# Patient Record
Sex: Female | Born: 1950 | Race: White | Hispanic: No | Marital: Married | Smoking: Current every day smoker
Health system: Southern US, Community
[De-identification: ages and names within clinical notes are randomized; demographics above are authoritative.]

## PROBLEM LIST (undated history)

## (undated) DIAGNOSIS — I10 Essential (primary) hypertension: Secondary | ICD-10-CM

## (undated) DIAGNOSIS — T7840XA Allergy, unspecified, initial encounter: Secondary | ICD-10-CM

## (undated) DIAGNOSIS — F329 Major depressive disorder, single episode, unspecified: Secondary | ICD-10-CM

## (undated) DIAGNOSIS — F32A Depression, unspecified: Secondary | ICD-10-CM

## (undated) DIAGNOSIS — J9383 Other pneumothorax: Secondary | ICD-10-CM

## (undated) HISTORY — DX: Other pneumothorax: J93.83

## (undated) HISTORY — DX: Depression, unspecified: F32.A

## (undated) HISTORY — DX: Major depressive disorder, single episode, unspecified: F32.9

## (undated) HISTORY — DX: Allergy, unspecified, initial encounter: T78.40XA

---

## 1976-03-19 HISTORY — PX: ABDOMINAL HYSTERECTOMY: SHX81

## 2011-07-26 ENCOUNTER — Ambulatory Visit: Payer: Self-pay | Admitting: Internal Medicine

## 2011-07-27 ENCOUNTER — Encounter: Payer: Self-pay | Admitting: Physician Assistant

## 2011-07-27 ENCOUNTER — Ambulatory Visit (INDEPENDENT_AMBULATORY_CARE_PROVIDER_SITE_OTHER): Payer: BC Managed Care – PPO | Admitting: Physician Assistant

## 2011-07-27 VITALS — BP 125/77 | HR 71 | Ht 65.0 in | Wt 144.0 lb

## 2011-07-27 DIAGNOSIS — E785 Hyperlipidemia, unspecified: Secondary | ICD-10-CM | POA: Insufficient documentation

## 2011-07-27 DIAGNOSIS — H6091 Unspecified otitis externa, right ear: Secondary | ICD-10-CM

## 2011-07-27 DIAGNOSIS — F329 Major depressive disorder, single episode, unspecified: Secondary | ICD-10-CM | POA: Insufficient documentation

## 2011-07-27 DIAGNOSIS — J302 Other seasonal allergic rhinitis: Secondary | ICD-10-CM | POA: Insufficient documentation

## 2011-07-27 DIAGNOSIS — H60399 Other infective otitis externa, unspecified ear: Secondary | ICD-10-CM

## 2011-07-27 DIAGNOSIS — Z7689 Persons encountering health services in other specified circumstances: Secondary | ICD-10-CM

## 2011-07-27 MED ORDER — NEOMYCIN-POLYMYXIN-HC 3.5-10000-1 OT SOLN: 3.0000 [drp] | Freq: Four times a day (QID) | OTIC | Status: AC

## 2011-07-27 NOTE — Patient Instructions (Signed)
Follow up when need refills. Will give Cortisporin 3 drops right ear four times a day.

## 2011-07-27 NOTE — Progress Notes (Signed)
  Subjective:    Patient ID: Shelley Moore, female    DOB: 05-20-1950, 61 y.o.   MRN: [id number]  HPI Patient presents as a new patient to establish care. Patient PMH and medications were reviewed. Health maintenance is up to date. She's moved here from [place] and [place] should be sending her previous labs and notes. She does bring a most recent that were completed in February of this year. All labs appear to be normal and will recheck in 1 year. She does have a past medical history of 4 spontaneous pneumothorax that were in the 1990s. She also presents with right ear pain for the last 2 days. She denies any discharge coming from the ear. She does have a stiff for neck of the right side. She denies any fever, sinus pressure. She does have a runny nose with some cough. She has not done anything to make better.  Patient is doing very well on Effexor. She's been on this for over a year.    Review of Systems     Objective:   Physical Exam  Constitutional: She is oriented to person, place, and time. She appears well-developed and well-nourished.  HENT:  Head: Normocephalic and atraumatic.  Left Ear: External ear normal.  Nose: Nose normal.  Mouth/Throat: Oropharynx is clear and moist.       Tenderness with pressure on tragus of right ear, canal was very erythematous with no purulent drainage. TM of both right and left were normal.   Neck: Normal range of motion. Neck supple.       Tenderness to palpation of the right side of neck. No lymphnodes were enlarged.   Cardiovascular: Normal rate, regular rhythm and normal heart sounds.   Pulmonary/Chest: Effort normal and breath sounds normal.  Lymphadenopathy:    She has no cervical adenopathy.  Neurological: She is alert and oriented to person, place, and time.  Skin: Skin is warm and dry.  Psychiatric: She has a normal mood and affect. Her behavior is normal.          Assessment & Plan:  Right otitis media- Gave Cortisporin drop  to use for 7 days. Call if not improving. Follow up as needed for depression and other health needs. Suggested to continue to use Zyrtec daily.  Depression-patient was well-controlled on Effexor. She'll followup as needed for refills.

## 2011-07-30 ENCOUNTER — Encounter: Payer: Self-pay | Admitting: Physician Assistant

## 2011-08-06 ENCOUNTER — Other Ambulatory Visit: Payer: Self-pay | Admitting: *Deleted

## 2011-08-06 MED ORDER — VENLAFAXINE HCL ER 150 MG PO CP24: 150.0000 mg | ORAL_CAPSULE | Freq: Every day | ORAL | Status: DC

## 2012-03-17 ENCOUNTER — Encounter: Payer: Self-pay | Admitting: Sports Medicine

## 2012-03-17 ENCOUNTER — Ambulatory Visit (INDEPENDENT_AMBULATORY_CARE_PROVIDER_SITE_OTHER): Payer: Self-pay | Admitting: Sports Medicine

## 2012-03-17 VITALS — BP 153/95 | HR 78 | Temp 98.5°F | Wt 151.0 lb

## 2012-03-17 DIAGNOSIS — F32A Depression, unspecified: Secondary | ICD-10-CM

## 2012-03-17 DIAGNOSIS — F3289 Other specified depressive episodes: Secondary | ICD-10-CM

## 2012-03-17 DIAGNOSIS — R011 Cardiac murmur, unspecified: Secondary | ICD-10-CM

## 2012-03-17 DIAGNOSIS — R6889 Other general symptoms and signs: Secondary | ICD-10-CM

## 2012-03-17 DIAGNOSIS — R51 Headache: Secondary | ICD-10-CM

## 2012-03-17 DIAGNOSIS — F329 Major depressive disorder, single episode, unspecified: Secondary | ICD-10-CM

## 2012-03-17 DIAGNOSIS — R519 Headache, unspecified: Secondary | ICD-10-CM | POA: Insufficient documentation

## 2012-03-17 DIAGNOSIS — J111 Influenza due to unidentified influenza virus with other respiratory manifestations: Secondary | ICD-10-CM | POA: Insufficient documentation

## 2012-03-17 LAB — POCT INFLUENZA A/B
Influenza A, POC: POSITIVE
Influenza B, POC: NEGATIVE

## 2012-03-17 MED ORDER — HYDROCOD POLST-CHLORPHEN POLST 10-8 MG/5ML PO LQCR: 5.0000 mL | Freq: Two times a day (BID) | ORAL | Status: DC | PRN

## 2012-03-17 MED ORDER — VENLAFAXINE HCL ER 150 MG PO CP24: 150.0000 mg | ORAL_CAPSULE | Freq: Every day | ORAL | Status: DC

## 2012-03-17 MED ORDER — OSELTAMIVIR PHOSPHATE 75 MG PO CAPS: 75.0000 mg | ORAL_CAPSULE | Freq: Two times a day (BID) | ORAL | Status: DC

## 2012-03-17 NOTE — Progress Notes (Signed)
Subjective:    CC: Cough  HPI:  This is a very pleasant 61 year old female who comes in with a two-day history of cough, body aches, muscle aches, subjective fevers and chills, rhinorrhea. Some sinus pressure is present radiating to ears.  Daughter was diagnosed with influenza-like illness. Denies any GI symptoms. Symptoms are moderate. Occasional headaches are present.  Past medical history, Surgical history, Family history, Social history, Allergies, and medications have been entered into the medical record, reviewed, and no changes needed.   Review of Systems: No night sweats, weight loss, chest pain, or shortness of breath.   Objective:    General: Well Developed, well nourished, and in no acute distress.  Neuro: Alert and oriented x3, extra-ocular muscles intact.  HEENT: Normocephalic, atraumatic, pupils equal round reactive to light, neck supple, no masses, no lymphadenopathy, thyroid nonpalpable. When sitting upright, carotid pulsations are visible, common carotid does feel somewhat enlarged. Skin: Warm and dry, no rashes. Cardiac: Regular rate and rhythm, there is a 3/6 systolic ejection murmur heard best at the left fourth intercostal space parasternal.  She also has visible right-sided carotid pulsation, that is palpable as well. It does feel as though there may be a carotid aneurysm Respiratory: Clear to auscultation bilaterally. Not using accessory muscles, speaking in full sentences.  Rapid influenza test is positive.  Impression and Recommendations:

## 2012-03-17 NOTE — Assessment & Plan Note (Signed)
Refilled Venlafaxine 

## 2012-03-17 NOTE — Assessment & Plan Note (Signed)
With positive flu test. Tamiflu. Tussionex.

## 2012-03-17 NOTE — Assessment & Plan Note (Addendum)
With what may be a carotid aneurysm. She has not yet had this evaluated due to no insurance. I do think that we need to get an echocardiogram, as well as carotid Dopplers. She denies any strokelike symptoms. Denies headaches. Denies chest pain.

## 2012-03-18 ENCOUNTER — Ambulatory Visit (HOSPITAL_COMMUNITY)
Admission: RE | Admit: 2012-03-18 | Discharge: 2012-03-18 | Disposition: A | Discharge status: Home / Self Care | Payer: Self-pay | Source: Ambulatory Visit | Attending: Sports Medicine | Admitting: Sports Medicine

## 2012-03-18 DIAGNOSIS — R011 Cardiac murmur, unspecified: Secondary | ICD-10-CM | POA: Insufficient documentation

## 2012-03-18 DIAGNOSIS — E785 Hyperlipidemia, unspecified: Secondary | ICD-10-CM | POA: Insufficient documentation

## 2012-03-18 DIAGNOSIS — G459 Transient cerebral ischemic attack, unspecified: Secondary | ICD-10-CM

## 2012-03-18 DIAGNOSIS — R509 Fever, unspecified: Secondary | ICD-10-CM | POA: Insufficient documentation

## 2012-03-18 NOTE — Progress Notes (Signed)
*  PRELIMINARY RESULTS* Vascular Ultrasound Carotid Duplex (Doppler) has been completed.   There is no evidence of significant internal carotid artery stenosis >40% bilaterally. Vertebral arteries are patent with antegrade flow. The right common carotid artery demonstrates focal dilatation at the origin measuring 1.41cm.  03/18/2012 12:06 PM Gertie Fey, RDMS, RDCS

## 2012-04-04 ENCOUNTER — Encounter: Payer: Self-pay | Admitting: Physician Assistant

## 2012-04-04 ENCOUNTER — Ambulatory Visit (INDEPENDENT_AMBULATORY_CARE_PROVIDER_SITE_OTHER): Payer: Self-pay | Admitting: Physician Assistant

## 2012-04-04 VITALS — BP 168/88 | HR 68 | Wt 142.0 lb

## 2012-04-04 DIAGNOSIS — I10 Essential (primary) hypertension: Secondary | ICD-10-CM

## 2012-04-04 DIAGNOSIS — R059 Cough, unspecified: Secondary | ICD-10-CM

## 2012-04-04 DIAGNOSIS — F17200 Nicotine dependence, unspecified, uncomplicated: Secondary | ICD-10-CM

## 2012-04-04 DIAGNOSIS — R05 Cough: Secondary | ICD-10-CM

## 2012-04-04 DIAGNOSIS — Z13220 Encounter for screening for lipoid disorders: Secondary | ICD-10-CM

## 2012-04-04 DIAGNOSIS — Z720 Tobacco use: Secondary | ICD-10-CM

## 2012-04-04 DIAGNOSIS — Z79899 Other long term (current) drug therapy: Secondary | ICD-10-CM

## 2012-04-04 MED ORDER — ASPIRIN EC 81 MG PO TBEC: 81.0000 mg | DELAYED_RELEASE_TABLET | Freq: Every day | ORAL | Status: AC

## 2012-04-04 MED ORDER — ALBUTEROL SULFATE HFA 108 (90 BASE) MCG/ACT IN AERS: 2.0000 | INHALATION_SPRAY | Freq: Four times a day (QID) | RESPIRATORY_TRACT | Status: DC | PRN

## 2012-04-04 MED ORDER — LISINOPRIL-HYDROCHLOROTHIAZIDE 20-12.5 MG PO TABS: 1.0000 | ORAL_TABLET | Freq: Every day | ORAL | Status: DC

## 2012-04-04 NOTE — Patient Instructions (Addendum)
Start lisinopril/HCTZ daily. Recheck BP daily and call with overall average in 2 weeks. Then come in 4 weeks for office check.   Get labs in between.   Start using albuterol inhaler every 6 hours as needed for cough. If continuing to use then need to call office and we may need to switch to a daily inhaler. May need to consider testing at some point if get insurance.

## 2012-04-06 NOTE — Progress Notes (Signed)
  Subjective:    Patient ID: Shelley Moore, female    DOB: July 11, 1950, 62 y.o.   MRN: [id number]  Hypertension This is a new problem. Episode onset: Started having elevated BP's in the last 3 months.  The problem has been rapidly worsening since onset. The problem is uncontrolled. Associated symptoms include shortness of breath. Pertinent negatives include no anxiety, blurred vision, chest pain, headaches, malaise/fatigue, neck pain, orthopnea, palpitations, peripheral edema, PND or sweats. There are no associated agents to hypertension. Risk factors for coronary artery disease include dyslipidemia, post-menopausal state, family history and smoking/tobacco exposure. Past treatments include nothing.    Pt has had an on and off again cough since November and flu diagnoses. She denies and fever, chills, muscle aches, acid reflux. Cough is not productive. She does not noticed it is after food. Can be anytime but worse when she goes outside and at night. She does not have any other UR symptoms except some post nasal drip. She does feel a little SOB when coughing but able to do activites without complication. Never had any asthma or lung issues. Pt does smoke and has for 30 plus years. She is not interested in quiting.    Review of Systems  Constitutional: Negative for malaise/fatigue.  HENT: Negative for neck pain.   Eyes: Negative for blurred vision.  Respiratory: Positive for shortness of breath.   Cardiovascular: Negative for chest pain, palpitations, orthopnea and PND.  Neurological: Negative for headaches.       Objective:   Physical Exam  Constitutional: She is oriented to person, place, and time. She appears well-developed and well-nourished.  HENT:  Head: Normocephalic and atraumatic.  Right Ear: External ear normal.  Left Ear: External ear normal.  Nose: Nose normal.       TM's clear.   PND on oropharynx.  Eyes: Conjunctivae normal are normal.  Neck: Normal range of motion.  Neck supple.  Cardiovascular: Normal rate, regular rhythm and intact distal pulses.        1/6 systolic ejection murmur.  Pulmonary/Chest: Effort normal.       Coarse breath sounds.   Lymphadenopathy:    She has no cervical adenopathy.  Neurological: She is alert and oriented to person, place, and time.  Skin: Skin is warm and dry.  Psychiatric: She has a normal mood and affect. Her behavior is normal.          Assessment & Plan:  HTN- Will start LIsionpril/HCTZ. Discussed side effect of cough. Can change if occurs. Discussed low salt diet and importance of regular exercise. Will check fasting cholesterol. Pt already taking ASA daily. Call with BP reading in 2 weeks and follow up in 4 weeks. Discussed stroke risk and need to stop smoking.    Cough- May be from PND start zyrtec. More likely due to chronic inflammation of lungs due to smoking. Start albuterol inhaler and see if it helps. Use as needed up to every 6 hours. If not improving and using inhaler once or twice a week at most. Need to start a daily inhaler to control symptoms. When get insurance could consider spirometry. Will follow up in 4 weeks and see if need to change medication.   Tobacco abuse- Pt is not ready to quit. Went over implications of continuing to smoke and pt states awareness but not ready.

## 2012-04-10 ENCOUNTER — Telehealth: Payer: Self-pay | Admitting: *Deleted

## 2012-04-10 NOTE — Telephone Encounter (Signed)
Pt calls & states that she doesn't think the bp med is really helping.  She is taking her bp every day at home.  Pt states that her bp this morning was 190/90.  This was taken 10 min after she took her medicine.  I advised her to wait at least 30 min after taking her medication to check her bp.  Please advise.

## 2012-04-11 NOTE — Telephone Encounter (Signed)
Pt.notified

## 2012-04-11 NOTE — Telephone Encounter (Signed)
Increase to 2 tabs daily and yes she needs to wait 30 minutes to an hour after taking medication to get accurate measurement. Call if continuing to get high number like reporting now.

## 2012-04-14 ENCOUNTER — Other Ambulatory Visit: Payer: Self-pay | Admitting: Physician Assistant

## 2012-04-15 LAB — COMPREHENSIVE METABOLIC PANEL
AST: 19 U/L (ref 0–37)
Alkaline Phosphatase: 54 U/L (ref 39–117)
BUN: 16 mg/dL (ref 6–23)
Creat: 0.71 mg/dL (ref 0.50–1.10)
Potassium: 4.5 mEq/L (ref 3.5–5.3)
Total Bilirubin: 0.5 mg/dL (ref 0.3–1.2)

## 2012-04-15 LAB — LIPID PANEL
HDL: 74 mg/dL (ref >39)
LDL Cholesterol: 143 mg/dL — ABNORMAL HIGH (ref 0–99)
Total CHOL/HDL Ratio: 3.2 Ratio
VLDL: 17 mg/dL (ref 0–40)

## 2012-04-16 MED ORDER — PRAVASTATIN SODIUM 40 MG PO TABS: 40.0000 mg | ORAL_TABLET | Freq: Every day | ORAL | Status: DC

## 2012-04-16 MED ORDER — LISINOPRIL-HYDROCHLOROTHIAZIDE 20-12.5 MG PO TABS: ORAL_TABLET | ORAL | Status: DC

## 2012-04-16 NOTE — Addendum Note (Signed)
Addended by: Jomarie Longs on: 04/16/2012 04:37 PM   Modules accepted: Orders

## 2012-04-16 NOTE — Progress Notes (Signed)
Remind patient to let us know if she starts to have any muscle pain or cramping. BP looking good today.

## 2012-04-17 ENCOUNTER — Other Ambulatory Visit: Payer: Self-pay | Admitting: *Deleted

## 2012-04-17 MED ORDER — PRAVASTATIN SODIUM 40 MG PO TABS: 40.0000 mg | ORAL_TABLET | Freq: Every day | ORAL | Status: DC

## 2012-04-17 MED ORDER — LISINOPRIL-HYDROCHLOROTHIAZIDE 20-12.5 MG PO TABS: ORAL_TABLET | ORAL | Status: DC

## 2012-05-03 ENCOUNTER — Other Ambulatory Visit: Payer: Self-pay

## 2012-05-26 ENCOUNTER — Telehealth: Payer: Self-pay | Admitting: *Deleted

## 2012-05-26 NOTE — Telephone Encounter (Signed)
No treatment for subconjunctival hemorrhage. Artifical tears OTC can help with irritation. Should resolve on its on in 1-2 weeks.

## 2012-05-26 NOTE — Telephone Encounter (Signed)
Pt called & LMOM at the front desk stating that she has ruptured a blood vessel in her eye & is wanting to know if you can call her something in to the [facility] on [address].

## 2012-05-27 NOTE — Telephone Encounter (Signed)
Pt notified via VM. Instructed to call back if any questions. Barry Dienes, LPN

## 2012-08-18 ENCOUNTER — Telehealth: Payer: Self-pay | Admitting: Physician Assistant

## 2012-08-18 ENCOUNTER — Other Ambulatory Visit (HOSPITAL_COMMUNITY)
Admission: RE | Admit: 2012-08-18 | Discharge: 2012-08-18 | Disposition: A | Discharge status: Home / Self Care | Payer: Commercial Indemnity | Source: Ambulatory Visit | Attending: Family Medicine | Admitting: Family Medicine

## 2012-08-18 ENCOUNTER — Encounter: Payer: Self-pay | Admitting: Physician Assistant

## 2012-08-18 ENCOUNTER — Ambulatory Visit (INDEPENDENT_AMBULATORY_CARE_PROVIDER_SITE_OTHER): Payer: Commercial Indemnity | Admitting: Physician Assistant

## 2012-08-18 VITALS — BP 124/80 | HR 64 | Wt 145.0 lb

## 2012-08-18 DIAGNOSIS — F329 Major depressive disorder, single episode, unspecified: Secondary | ICD-10-CM

## 2012-08-18 DIAGNOSIS — Z01419 Encounter for gynecological examination (general) (routine) without abnormal findings: Secondary | ICD-10-CM

## 2012-08-18 DIAGNOSIS — E785 Hyperlipidemia, unspecified: Secondary | ICD-10-CM

## 2012-08-18 DIAGNOSIS — Z124 Encounter for screening for malignant neoplasm of cervix: Secondary | ICD-10-CM

## 2012-08-18 DIAGNOSIS — Z1151 Encounter for screening for human papillomavirus (HPV): Secondary | ICD-10-CM | POA: Insufficient documentation

## 2012-08-18 LAB — LIPID PANEL: Cholesterol: 185 mg/dL (ref 0–200)

## 2012-08-18 MED ORDER — VENLAFAXINE HCL ER 150 MG PO CP24: 150.0000 mg | ORAL_CAPSULE | Freq: Every day | ORAL | Status: DC

## 2012-08-18 NOTE — Patient Instructions (Addendum)
Find out about last mammogram and what is requested.  Continue on Calcium.  Continue on Blood pressure medication.   Trigger Finger Trigger finger (digital tendinitis and stenosing tenosynovitis) is a common disorder that causes an often painful catching of the fingers or thumb. It occurs as a clicking, snapping or locking of a finger in the palm of the hand. The reason for this is that there is a problem with the tendons which flex the fingers sliding smoothly through their sheaths. The cause of this may be inflammation of the tendon and sheath, or from a thickening or nodule in the tendon. The condition may occur in any finger or a couple fingers at the same time. The cause may be overuse while doing the same activity over and over again with your hands.  Tendons are the tough cords that connect the muscles to bones. Muscles and tendons are part of the system which allows your body to move. When muscles contract in the forearm on the palm side, they pull the tendons toward the elbow and cause the fingers and thumb to bend (flex) toward the palm. These are the flexor tendons. The tendons slide through a slippery smooth membrane (synovium) which is called the tendon sheath. The sheaths have areas of tough fibrous tissues surrounding them which hold the tendons close to the bone. These are called pulleys because they work like a pulley. The first pulley is in the palm of the hand near the crease which runs across your palm. If the area of the tendon thickening is near the pulley, the tendon cannot slide smoothly through the pulley and this causes the trigger finger. The finger may lock with the finger curled or suddenly straighten out with a snap. This is more common in patients with rheumatoid arthritis and diabetes. Left untreated, the condition may get worse to the point where the finger becomes locked in flexion, like making a fist, or less commonly locked with the finger straightened out. DIAGNOSIS   Your caregiver will easily make this diagnosis on examination. TREATMENT   Splinting for 6 to 8 weeks of time may be helpful. Use the splints as your caregiver suggests.  Heat used for twenty minutes at least four times a day followed by ice packs for twenty minutes unless directed otherwise by your caregiver may be helpful. If you find either heat or cold seems to be making the problem worse, quit using them and ask your caregiver for directions.  Cortisone injections along with splinting may speed up recovery. Several injections may be required. Cortisone may give relief after one injection.  Only take over-the-counter or prescription medicines for pain, discomfort, or fever as directed by your caregiver.  Surgery is another treatment that may be used if conservative treatments using injection and splinting does not work. Surgery can be minor without incisions (a cut does not have to be made) and can be done with a needle through the skin. No stitches are needed and most patients may return to work the same day.  Other surgical choices involve an open procedure where the surgeon opens the hand through a small incision (cut) and cuts the pulley so the tendon can again slide smoothly. Your hand will still work fine. This small operation requires stitches and the recovery will be a little longer and the incisions will need to be protected until completely healed. You may have to limit your activities for up to 6 months.  Occupational or hand therapy may be required if  there is stiffness remaining in the finger. RISKS AND COMPLICATIONS Complications are uncommon but some problems that may occur are:  Recurrence of the trigger finger. This does not mean that the surgery was not well done. It simply means that you may have formed scar tissue following surgery that causes the problem to reoccur.  Infection which could ruin the results of the surgery and can result in a finger which is frozen and  can not move normally.  Nerve injury is possible which could result in permanent numbness of one or more fingers. CARE AFTER SURGERY  Elevate your hand above your heart and use ice as instructed.  Follow instructions regarding finger motion/exercise.  Keep the surgical wound dry for at least 48 hrs or longer if instructed.  Keep your follow-up appointments.  Return to work and normal activities as instructed. SEEK IMMEDIATE MEDICAL CARE IF:  Your problems are getting worse or you do not obtain relief from the treatment. Document Released: 12/24/2003 Document Revised: 05/28/2011 Document Reviewed: 08/17/2008 [facility] Patient Information 2014 [place], [place].

## 2012-08-18 NOTE — Telephone Encounter (Signed)
Pt notified of instructions

## 2012-08-18 NOTE — Telephone Encounter (Signed)
Please call pt. I did not go over how to splint. Buddy tape index finger and pinky finger together to help give rest. Heat finger 2 times a day. If not improving call for appt for injection with dr. Karie Schwalbe.

## 2012-08-18 NOTE — Progress Notes (Signed)
  Subjective:     Shelley Moore is a 62 y.o. female and is here for a comprehensive physical exam. The patient reports problems - She has started having problems with her trigger finger of left index finger. she has had injection before about 1 year ago and worked great. Not locking up like has in past but starting too. not tried anything to make better. Marland Kitchen  History   Social History  . Marital Status: Married    Spouse Name: N/A    Number of Children: N/A  . Years of Education: N/A   Occupational History  . Not on file.   Social History Main Topics  . Smoking status: Current Every Day Smoker -- 1.00 packs/day    Types: Cigarettes  . Smokeless tobacco: Not on file  . Alcohol Use: No  . Drug Use: No  . Sexually Active: Yes -- Female partner(s)   Other Topics Concern  . Not on file   Social History Narrative  . No narrative on file   Health Maintenance  Topic Date Due  . Influenza Vaccine  11/17/2012  . Mammogram  04/28/2013  . Pap Smear  04/19/2014  . Colonoscopy  07/26/2016  . Tetanus/tdap  07/27/2019  . Zostavax  Addressed    The following portions of the patient's history were reviewed and updated as appropriate: allergies, current medications, past family history, past medical history, past social history, past surgical history and problem list.  Review of Systems A comprehensive review of systems was negative.   Objective:    BP 124/80  Pulse 64  Wt 145 lb (65.772 kg)  BMI 24.13 kg/m2 General appearance: alert, cooperative and appears stated age Head: Normocephalic, without obvious abnormality, atraumatic Eyes: conjunctivae/corneas clear. PERRL, EOM's intact. Fundi benign. Ears: normal TM's and external ear canals both ears Nose: Nares normal. Septum midline. Mucosa normal. No drainage or sinus tenderness. Throat: lips, mucosa, and tongue normal; teeth and gums normal Neck: no adenopathy, no carotid bruit, no JVD, supple, symmetrical, trachea midline and  thyroid not enlarged, symmetric, no tenderness/mass/nodules Back: symmetric, no curvature. ROM normal. No CVA tenderness. Lungs: clear to auscultation bilaterally Breasts: normal appearance, no masses or tenderness Heart: RRR, Grade I systolic ejection murmur Abdomen: soft, non-tender; bowel sounds normal; no masses,  no organomegaly Pelvic: cervix normal in appearance, external genitalia normal, no adnexal masses or tenderness, no cervical motion tenderness, uterus normal size, shape, and consistency and vagina normal without discharge Extremities: extremities normal, atraumatic, no cyanosis or edema Pulses: 2+ and symmetric Skin: Skin color, texture, turgor normal. No rashes or lesions Lymph nodes: Cervical, supraclavicular, and axillary nodes normal. Neurologic: Grossly normal    Assessment:    Healthy female exam.      Plan:    CPE- Pt requested pap today despite hysterectomy. Explained do not need pap as frequently every 5-10 years. Needs mammogram. Would like to get results for [place] so they have something to compare to. Colonoscopy in last 5 years. Continue calicum and regular exercise. Blood pressure looks great. Anxiety controlled with effexor(needs refill). BP controlled continue on lisinopril/HCTZ. Start symptomatic control of trigger finget if continues or worsens come see Dr. Karie Schwalbe for injection. Will recheck lipids after starting statin.  See After Visit Summary for Counseling Recommendations

## 2012-08-19 ENCOUNTER — Other Ambulatory Visit: Payer: Self-pay | Admitting: Physician Assistant

## 2012-08-19 MED ORDER — PRAVASTATIN SODIUM 40 MG PO TABS: 40.0000 mg | ORAL_TABLET | Freq: Every day | ORAL | Status: DC

## 2012-09-02 ENCOUNTER — Telehealth: Payer: Self-pay | Admitting: *Deleted

## 2012-09-02 NOTE — Telephone Encounter (Signed)
Pt left a message today asking if you had reviewed her previous mammo films & figured out what type of mammogram she needs ordered.  Please advise

## 2012-09-03 ENCOUNTER — Other Ambulatory Visit: Payer: Self-pay | Admitting: Physician Assistant

## 2012-09-03 DIAGNOSIS — N6019 Diffuse cystic mastopathy of unspecified breast: Secondary | ICD-10-CM

## 2012-09-03 DIAGNOSIS — Z1239 Encounter for other screening for malignant neoplasm of breast: Secondary | ICD-10-CM

## 2012-09-03 NOTE — Telephone Encounter (Signed)
Pt.notified

## 2012-09-03 NOTE — Telephone Encounter (Signed)
Referred for screening mammogram and breast ultrasound via previous recommendation. Sorry for delay.

## 2012-09-20 ENCOUNTER — Emergency Department
Admission: EM | Admit: 2012-09-20 | Discharge: 2012-09-20 | Disposition: A | Discharge status: Home / Self Care | Payer: Commercial Indemnity | Source: Home / Self Care | Attending: Family Medicine | Admitting: Family Medicine

## 2012-09-20 ENCOUNTER — Encounter: Payer: Self-pay | Admitting: Emergency Medicine

## 2012-09-20 DIAGNOSIS — L255 Unspecified contact dermatitis due to plants, except food: Secondary | ICD-10-CM

## 2012-09-20 HISTORY — DX: Essential (primary) hypertension: I10

## 2012-09-20 MED ORDER — METHYLPREDNISOLONE SODIUM SUCC 125 MG IJ SOLR
80.0000 mg | Freq: Once | INTRAMUSCULAR | Status: AC
  Administered 2012-09-20: 80 mg via INTRAMUSCULAR

## 2012-09-20 MED ORDER — PREDNISONE 20 MG PO TABS: 20.0000 mg | ORAL_TABLET | Freq: Two times a day (BID) | ORAL | Status: DC

## 2012-09-20 NOTE — ED Notes (Signed)
Reports contact with poison ivy 2 days ago; has now spread to all 4 extremities.

## 2012-09-20 NOTE — ED Provider Notes (Signed)
History    CSN: [id number] Arrival date & time 09/20/12  1147  First MD Initiated Contact with Patient 09/20/12 1301     Chief Complaint  Patient presents with  . Dermatitis      HPI Comments: Patient complains of a two day history of poison ivy rash to her arms/legs.  She had been petting her dogs that had been in poison ivy.  Patient is a 62 y.o. female presenting with poison ivy. The history is provided by the patient.  Poison [place] This is a new problem. The current episode started 2 days ago. The problem occurs constantly. The problem has been gradually worsening. Associated symptoms comments: itching. Nothing aggravates the symptoms. Nothing relieves the symptoms. She has tried nothing for the symptoms.   Past Medical History  Diagnosis Date  . Allergy   . Depression   . Spontaneous pneumothorax     4 different times in the 90's.  Marland Kitchen Hypertension    Past Surgical History  Procedure Laterality Date  . Abdominal hysterectomy  1978    tumor benign   Family History  Problem Relation Age of Onset  . Stroke Mother    History  Substance Use Topics  . Smoking status: Current Every Day Smoker -- 1.00 packs/day    Types: Cigarettes  . Smokeless tobacco: Not on file  . Alcohol Use: No   OB History   Grav Para Term Preterm Abortions TAB SAB Ect Mult Living                 Review of Systems  All other systems reviewed and are negative.    Allergies  Review of patient's allergies indicates no known allergies.  Home Medications   Current Outpatient Rx  Name  Route  Sig  Dispense  Refill  . aspirin EC 81 MG tablet   Oral   Take 1 tablet (81 mg total) by mouth daily.   150 tablet   2   . cetirizine (ZYRTEC) 10 MG tablet   Oral   Take 10 mg by mouth daily.         Marland Kitchen lisinopril-hydrochlorothiazide (PRINZIDE,ZESTORETIC) 20-12.5 MG per tablet      Take 2 tabs daily.   60 tablet   6   . pravastatin (PRAVACHOL) 40 MG tablet   Oral   Take 1 tablet (40 mg  total) by mouth daily.   30 tablet   11   . predniSONE (DELTASONE) 20 MG tablet   Oral   Take 1 tablet (20 mg total) by mouth 2 (two) times daily. Take with food.  Begin on Sunday 09/21/12.   10 tablet   0   . venlafaxine XR (EFFEXOR-XR) 150 MG 24 hr capsule   Oral   Take 1 capsule (150 mg total) by mouth daily.   180 capsule   0    BP 112/71  Pulse 76  Temp(Src) 97.6 F (36.4 C) (Oral)  Resp 16  Ht 5\' 5"  (1.651 m)  Wt 142 lb (64.411 kg)  BMI 23.63 kg/m2  SpO2 99% Physical Exam  Nursing note and vitals reviewed. Constitutional: She is oriented to person, place, and time. She appears well-developed and well-nourished. No distress.  HENT:  Head: Atraumatic.  Mouth/Throat: Oropharynx is clear and moist.  Eyes: Conjunctivae are normal. Pupils are equal, round, and reactive to light.  Cardiovascular: Normal heart sounds.   Pulmonary/Chest: Breath sounds normal.  Neurological: She is alert and oriented to person, place, and time.  Skin: Skin is warm and dry. Rash noted. Rash is macular.     There is a macular, slightly erythematous eruption on exposed areas of arms/legs.  No tenderness or warmth.  No evidence cellulitis    ED Course  Procedures  none   1. Rhus dermatitis     MDM  SoluMedrol 80mg  IM Begin prednisone burst tomorrow. Followup with Family Doctor if not improved in 5 days.  Lattie Haw, MD 09/25/12 [phone]

## 2012-09-23 ENCOUNTER — Ambulatory Visit
Admission: RE | Admit: 2012-09-23 | Discharge: 2012-09-23 | Disposition: A | Discharge status: Home / Self Care | Payer: Commercial Indemnity | Source: Ambulatory Visit | Attending: Physician Assistant | Admitting: Physician Assistant

## 2012-09-23 DIAGNOSIS — N6019 Diffuse cystic mastopathy of unspecified breast: Secondary | ICD-10-CM

## 2012-09-23 DIAGNOSIS — Z1239 Encounter for other screening for malignant neoplasm of breast: Secondary | ICD-10-CM

## 2013-01-22 ENCOUNTER — Other Ambulatory Visit: Payer: Self-pay

## 2013-03-09 ENCOUNTER — Encounter: Payer: Self-pay | Admitting: Physician Assistant

## 2013-03-09 ENCOUNTER — Ambulatory Visit (INDEPENDENT_AMBULATORY_CARE_PROVIDER_SITE_OTHER): Payer: Commercial Indemnity | Admitting: Physician Assistant

## 2013-03-09 VITALS — BP 109/58 | HR 65 | Wt 141.0 lb

## 2013-03-09 DIAGNOSIS — I10 Essential (primary) hypertension: Secondary | ICD-10-CM | POA: Insufficient documentation

## 2013-03-09 DIAGNOSIS — F329 Major depressive disorder, single episode, unspecified: Secondary | ICD-10-CM

## 2013-03-09 DIAGNOSIS — E785 Hyperlipidemia, unspecified: Secondary | ICD-10-CM

## 2013-03-09 DIAGNOSIS — Z79899 Other long term (current) drug therapy: Secondary | ICD-10-CM

## 2013-03-09 DIAGNOSIS — S139XXA Sprain of joints and ligaments of unspecified parts of neck, initial encounter: Secondary | ICD-10-CM

## 2013-03-09 MED ORDER — VENLAFAXINE HCL ER 150 MG PO CP24: 150.0000 mg | ORAL_CAPSULE | Freq: Every day | ORAL | Status: DC

## 2013-03-09 MED ORDER — PRAVASTATIN SODIUM 40 MG PO TABS: 40.0000 mg | ORAL_TABLET | Freq: Every day | ORAL | Status: DC

## 2013-03-09 MED ORDER — LISINOPRIL-HYDROCHLOROTHIAZIDE 20-12.5 MG PO TABS: ORAL_TABLET | ORAL | Status: DC

## 2013-03-09 NOTE — Patient Instructions (Addendum)
BP is low decrease to 1/2 to 1 tab daily. If still low consider going off and checking BP regularly.   Cervical Strain and Sprain (Whiplash) with Rehab Cervical strain and sprains are injuries that commonly occur with "whiplash" injuries. Whiplash occurs when the neck is forcefully whipped backward or forward, such as during a motor vehicle accident. The muscles, ligaments, tendons, discs and nerves of the neck are susceptible to injury when this occurs. SYMPTOMS   Pain or stiffness in the front and/or back of neck  Symptoms may present immediately or up to 24 hours after injury.  Dizziness, headache, nausea and vomiting.  Muscle spasm with soreness and stiffness in the neck.  Tenderness and swelling at the injury site. CAUSES  Whiplash injuries often occur during contact sports or motor vehicle accidents.  RISK INCREASES WITH:  Osteoarthritis of the spine.  Situations that make head or neck accidents or trauma more likely.  High-risk sports (football, rugby, wrestling, hockey, auto racing, gymnastics, diving, contact karate or boxing).  Poor strength and flexibility of the neck.  Previous neck injury.  Poor tackling technique.  Improperly fitted or padded equipment. PREVENTION  Learn and use proper technique (avoid tackling with the head, spearing and head-butting; use proper falling techniques to avoid landing on the head).  Warm up and stretch properly before activity.  Maintain physical fitness:  Strength, flexibility and endurance.  Cardiovascular fitness.  Wear properly fitted and padded protective equipment, such as padded soft collars, for participation in contact sports. PROGNOSIS  Recovery for cervical strain and sprain injuries is dependent on the extent of the injury. These injuries are usually curable in 1 week to 3 months with appropriate treatment.  RELATED COMPLICATIONS   Temporary numbness and weakness may occur if the nerve roots are damaged, and  this may persist until the nerve has completely healed.  Chronic pain due to frequent recurrence of symptoms.  Prolonged healing, especially if activity is resumed too soon (before complete recovery). TREATMENT  Treatment initially involves the use of ice and medication to help reduce pain and inflammation. It is also important to perform strengthening and stretching exercises and modify activities that worsen symptoms so the injury does not get worse. These exercises may be performed at home or with a therapist. For patients who experience severe symptoms, a soft padded collar may be recommended to be worn around the neck.  Improving your posture may help reduce symptoms. Posture improvement includes pulling your chin and abdomen in while sitting or standing. If you are sitting, sit in a firm chair with your buttocks against the back of the chair. While sleeping, try replacing your pillow with a small towel rolled to 2 inches in diameter, or use a cervical pillow or soft cervical collar. Poor sleeping positions delay healing.  For patients with nerve root damage, which causes numbness or weakness, the use of a cervical traction apparatus may be recommended. Surgery is rarely necessary for these injuries. However, cervical strain and sprains that are present at birth (congenital) may require surgery. MEDICATION   If pain medication is necessary, nonsteroidal anti-inflammatory medications, such as aspirin and ibuprofen, or other minor pain relievers, such as acetaminophen, are often recommended.  Do not take pain medication for 7 days before surgery.  Prescription pain relievers may be given if deemed necessary by your caregiver. Use only as directed and only as much as you need. HEAT AND COLD:   Cold treatment (icing) relieves pain and reduces inflammation. Cold treatment should  be applied for 10 to 15 minutes every 2 to 3 hours for inflammation and pain and immediately after any activity that  aggravates your symptoms. Use ice packs or an ice massage.  Heat treatment may be used prior to performing the stretching and strengthening activities prescribed by your caregiver, physical therapist, or athletic trainer. Use a heat pack or a warm soak. SEEK MEDICAL CARE IF:   Symptoms get worse or do not improve in 2 weeks despite treatment.  New, unexplained symptoms develop (drugs used in treatment may produce side effects). EXERCISES RANGE OF MOTION (ROM) AND STRETCHING EXERCISES - Cervical Strain and Sprain These exercises may help you when beginning to rehabilitate your injury. In order to successfully resolve your symptoms, you must improve your posture. These exercises are designed to help reduce the forward-head and rounded-shoulder posture which contributes to this condition. Your symptoms may resolve with or without further involvement from your physician, physical therapist or athletic trainer. While completing these exercises, remember:   Restoring tissue flexibility helps normal motion to return to the joints. This allows healthier, less painful movement and activity.  An effective stretch should be held for at least 20 seconds, although you may need to begin with shorter hold times for comfort.  A stretch should never be painful. You should only feel a gentle lengthening or release in the stretched tissue. STRETCH- Axial Extensors  Lie on your back on the floor. You may bend your knees for comfort. Place a rolled up hand towel or dish towel, about 2 inches in diameter, under the part of your head that makes contact with the floor.  Gently tuck your chin, as if trying to make a "double chin," until you feel a gentle stretch at the base of your head.  Hold __________ seconds. Repeat __________ times. Complete this exercise __________ times per day.  STRETECH - Axial Extension   Stand or sit on a firm surface. Assume a good posture: chest up, shoulders drawn back, abdominal  muscles slightly tense, knees unlocked (if standing) and feet hip width apart.  Slowly retract your chin so your head slides back and your chin slightly lowers.Continue to look straight ahead.  You should feel a gentle stretch in the back of your head. Be certain not to feel an aggressive stretch since this can cause headaches later.  Hold for __________ seconds. Repeat __________ times. Complete this exercise __________ times per day. STRETCH  Cervical Side Bend   Stand or sit on a firm surface. Assume a good posture: chest up, shoulders drawn back, abdominal muscles slightly tense, knees unlocked (if standing) and feet hip width apart.  Without letting your nose or shoulders move, slowly tip your right / left ear to your shoulder until your feel a gentle stretch in the muscles on the opposite side of your neck.  Hold __________ seconds. Repeat __________ times. Complete this exercise __________ times per day. STRETCH  Cervical Rotators   Stand or sit on a firm surface. Assume a good posture: chest up, shoulders drawn back, abdominal muscles slightly tense, knees unlocked (if standing) and feet hip width apart.  Keeping your eyes level with the ground, slowly turn your head until you feel a gentle stretch along the back and opposite side of your neck.  Hold __________ seconds. Repeat __________ times. Complete this exercise __________ times per day. RANGE OF MOTION - Neck Circles   Stand or sit on a firm surface. Assume a good posture: chest up, shoulders drawn back,  abdominal muscles slightly tense, knees unlocked (if standing) and feet hip width apart.  Gently roll your head down and around from the back of one shoulder to the back of the other. The motion should never be forced or painful.  Repeat the motion 10-20 times, or until you feel the neck muscles relax and loosen. Repeat __________ times. Complete the exercise __________ times per day. STRENGTHENING EXERCISES - Cervical  Strain and Sprain These exercises may help you when beginning to rehabilitate your injury. They may resolve your symptoms with or without further involvement from your physician, physical therapist or athletic trainer. While completing these exercises, remember:   Muscles can gain both the endurance and the strength needed for everyday activities through controlled exercises.  Complete these exercises as instructed by your physician, physical therapist or athletic trainer. Progress the resistance and repetitions only as guided.  You may experience muscle soreness or fatigue, but the pain or discomfort you are trying to eliminate should never worsen during these exercises. If this pain does worsen, stop and make certain you are following the directions exactly. If the pain is still present after adjustments, discontinue the exercise until you can discuss the trouble with your clinician. STRENGTH Cervical Flexors, Isometric  Face a wall, standing about 6 inches away. Place a small pillow, a ball about 6-8 inches in diameter, or a folded towel between your forehead and the wall.  Slightly tuck your chin and gently push your forehead into the soft object. Push only with mild to moderate intensity, building up tension gradually. Keep your jaw and forehead relaxed.  Hold 10 to 20 seconds. Keep your breathing relaxed.  Release the tension slowly. Relax your neck muscles completely before you start the next repetition. Repeat __________ times. Complete this exercise __________ times per day. STRENGTH- Cervical Lateral Flexors, Isometric   Stand about 6 inches away from a wall. Place a small pillow, a ball about 6-8 inches in diameter, or a folded towel between the side of your head and the wall.  Slightly tuck your chin and gently tilt your head into the soft object. Push only with mild to moderate intensity, building up tension gradually. Keep your jaw and forehead relaxed.  Hold 10 to 20 seconds.  Keep your breathing relaxed.  Release the tension slowly. Relax your neck muscles completely before you start the next repetition. Repeat __________ times. Complete this exercise __________ times per day. STRENGTH  Cervical Extensors, Isometric   Stand about 6 inches away from a wall. Place a small pillow, a ball about 6-8 inches in diameter, or a folded towel between the back of your head and the wall.  Slightly tuck your chin and gently tilt your head back into the soft object. Push only with mild to moderate intensity, building up tension gradually. Keep your jaw and forehead relaxed.  Hold 10 to 20 seconds. Keep your breathing relaxed.  Release the tension slowly. Relax your neck muscles completely before you start the next repetition. Repeat __________ times. Complete this exercise __________ times per day. POSTURE AND BODY MECHANICS CONSIDERATIONS - Cervical Strain and Sprain Keeping correct posture when sitting, standing or completing your activities will reduce the stress put on different body tissues, allowing injured tissues a chance to heal and limiting painful experiences. The following are general guidelines for improved posture. Your physician or physical therapist will provide you with any instructions specific to your needs. While reading these guidelines, remember:  The exercises prescribed by your provider will help  you have the flexibility and strength to maintain correct postures.  The correct posture provides the optimal environment for your joints to work. All of your joints have less wear and tear when properly supported by a spine with good posture. This means you will experience a healthier, less painful body.  Correct posture must be practiced with all of your activities, especially prolonged sitting and standing. Correct posture is as important when doing repetitive low-stress activities (typing) as it is when doing a single heavy-load activity (lifting). PROLONGED  STANDING WHILE SLIGHTLY LEANING FORWARD When completing a task that requires you to lean forward while standing in one place for a long time, place either foot up on a stationary 2-4 inch high object to help maintain the best posture. When both feet are on the ground, the low back tends to lose its slight inward curve. If this curve flattens (or becomes too large), then the back and your other joints will experience too much stress, fatigue more quickly and can cause pain.  RESTING POSITIONS Consider which positions are most painful for you when choosing a resting position. If you have pain with flexion-based activities (sitting, bending, stooping, squatting), choose a position that allows you to rest in a less flexed posture. You would want to avoid curling into a fetal position on your side. If your pain worsens with extension-based activities (prolonged standing, working overhead), avoid resting in an extended position such as sleeping on your stomach. Most people will find more comfort when they rest with their spine in a more neutral position, neither too rounded nor too arched. Lying on a non-sagging bed on your side with a pillow between your knees, or on your back with a pillow under your knees will often provide some relief. Keep in mind, being in any one position for a prolonged period of time, no matter how correct your posture, can still lead to stiffness. WALKING Walk with an upright posture. Your ears, shoulders and hips should all line-up. OFFICE WORK When working at a desk, create an environment that supports good, upright posture. Without extra support, muscles fatigue and lead to excessive strain on joints and other tissues. CHAIR:  A chair should be able to slide under your desk when your back makes contact with the back of the chair. This allows you to work closely.  The chair's height should allow your eyes to be level with the upper part of your monitor and your hands to be slightly  lower than your elbows.  Body position:  Your feet should make contact with the floor. If this is not possible, use a foot rest.  Keep your ears over your shoulders. This will reduce stress on your neck and low back. Document Released: 03/05/2005 Document Revised: 06/30/2012 Document Reviewed: 06/17/2008 [facility] Patient Information 2014 [place], [place].

## 2013-03-09 NOTE — Progress Notes (Signed)
   Subjective:    Patient ID: Shelley Moore, female    DOB: [birth year], 62 y.o.   MRN: [id number]  HPI Patient presents to the clinic to followup and get medication refills.  Hypertension-patient is taking only one tablet the lisinopril/HCTZ currently. She has a blood pressure cuff at home and when she checks her blood pressure is staying in the low 100s. She does admit to some episodes of dizziness especially when she stands. Patient denies any chest pains, palpitations, headaches or vision changes. She is actively trying to lose weight and has lost 8 pounds since last visit. She is exercising and feels really good physically.  Hyperlipidemia-last labs were great. Continues to take Pravachol daily. Denies any side effects from medications.  Depression is well-controlled on Effexor 150 mg daily. Patient has no concerns or complaints today. Patient denies any feelings of hopelessness or helplessness.  Patient has had some neck stiffness over the past week. She slept on her neck wrong about a week ago and has felt very achy. She has had a massage and using warm compresses on back. She does not like [place] relaxer specific for her to sleep.   Review of Systems     Objective:   Physical Exam  Constitutional: She is oriented to person, place, and time. She appears well-developed and well-nourished.  HENT:  Head: Normocephalic and atraumatic.  Cardiovascular: Normal rate and regular rhythm.   Murmur heard. Systolic murmur 3/6.  Pulmonary/Chest: Effort normal and breath sounds normal. She has no wheezes.  Musculoskeletal:  Range of motion of neck normal and without pain. No tenderness over C-spine. Muscles around C-spine or tight to palpation.  Neurological: She is alert and oriented to person, place, and time.  Skin: Skin is warm and dry.  Psychiatric: She has a normal mood and affect. Her behavior is normal.          Assessment & Plan:  Hypertension- patient's blood pressure remains  low today despite decreasing to 1 tab daily. Discussed with patient we could do a trial without any blood pressure medication. Patient did not want to do that at this time. Discussed with her she could cut the 1 tab in half and take daily. Continue to monitor her blood pressure regularly. Is continued to stay in the low 100s that she does need to think about stopping medication completely. Patient does have a blood pressure cuff at home. If the dizziness gets worse or not improving please check blood pressure and keep [place] informed. Will check CMP today. Followup in 6 months.  Hyperlipidemia-LDL looked great at last visit 09/2012. Will not check until next July. Refilled Pravachol today. We'll check liver enzymes with CMP today.  Depression-well controlled today with Effexor. Refilled Effexor for 6 months. We'll check CMP today.  Cervical sprain-continue to use warm compresses along with massage therapy. I did give some exercises to start to work on range of motion. Consider physical therapy if not improving. Offered muscle relaxer but patient declined. Ibuprofen could also help with some of the symptoms.

## 2013-03-10 LAB — COMPLETE METABOLIC PANEL WITH GFR
ALT: 18 U/L (ref 0–35)
BUN: 18 mg/dL (ref 6–23)
CO2: 33 mEq/L — ABNORMAL HIGH (ref 19–32)
Calcium: 9.6 mg/dL (ref 8.4–10.5)
Chloride: 100 mEq/L (ref 96–112)
GFR, Est African American: >89 mL/min
GFR, Est Non African American: >89 mL/min
Glucose, Bld: 88 mg/dL (ref 70–99)
Potassium: 4 mEq/L (ref 3.5–5.3)
Sodium: 138 mEq/L (ref 135–145)
Total Bilirubin: 0.4 mg/dL (ref 0.3–1.2)
Total Protein: 6.4 g/dL (ref 6.0–8.3)

## 2013-04-03 ENCOUNTER — Other Ambulatory Visit: Payer: Self-pay | Admitting: Physician Assistant

## 2013-05-05 ENCOUNTER — Other Ambulatory Visit: Payer: Self-pay | Admitting: Physician Assistant

## 2013-05-26 ENCOUNTER — Encounter: Payer: Self-pay | Admitting: Physician Assistant

## 2013-05-26 ENCOUNTER — Ambulatory Visit (INDEPENDENT_AMBULATORY_CARE_PROVIDER_SITE_OTHER): Payer: Commercial Indemnity | Admitting: Physician Assistant

## 2013-05-26 VITALS — BP 129/77 | HR 57 | Wt 142.0 lb

## 2013-05-26 DIAGNOSIS — F329 Major depressive disorder, single episode, unspecified: Secondary | ICD-10-CM | POA: Insufficient documentation

## 2013-05-26 NOTE — Patient Instructions (Signed)
Will get psychiatry and counselor in the area.

## 2013-05-26 NOTE — Progress Notes (Signed)
   Subjective:    Patient ID: Shelley CockayneVickie Saran, female    DOB: 1950/11/05, 63 y.o.   MRN: [id number]  HPI Pt is a 63 yo female who presents to the clinic to follow up on hospital admission. On 05/12/13 she was arguing with her husband and threatened suicide he called cops and sent [facility] to [facility] and locked her up for 6 days until she was better. Per pt she was not serious and did not have a plan but she completley unaware at how depressed she really was. She was not going to work, not getting out of bed and just didn't care. Her meds were switched from effexor to lexapro. She feels great today. She is 100 percent better. Per pt "she feels like she could do cartwheels down the street". She is working, happy and no complaints. She is following up with psychiatrist from hospital but would like one in [place]. She also loved the group/private counseling and would like to get a counselor here in [place].     Review of Systems     Objective:   Physical Exam  Constitutional: She is oriented to person, place, and time. She appears well-developed and well-nourished.  HENT:  Head: Normocephalic and atraumatic.  Cardiovascular: Normal rate, regular rhythm and normal heart sounds.   Pulmonary/Chest: Effort normal and breath sounds normal.  Neurological: She is alert and oriented to person, place, and time.  Skin: Skin is dry.  Psychiatric: She has a normal mood and affect. Her behavior is normal.          Assessment & Plan:   Major Depression- PHQ-9 was 0 today. Will refer to psychiatrist and counselor in [place]. Recommended to follow up with psych from hospital until able to get an appt. Continue on lexapro.

## 2013-06-11 ENCOUNTER — Other Ambulatory Visit: Payer: Self-pay | Admitting: *Deleted

## 2013-06-11 MED ORDER — PRAVASTATIN SODIUM 40 MG PO TABS: 40.0000 mg | ORAL_TABLET | Freq: Every day | ORAL | Status: DC

## 2013-06-11 MED ORDER — LISINOPRIL-HYDROCHLOROTHIAZIDE 20-12.5 MG PO TABS: ORAL_TABLET | ORAL | Status: DC

## 2013-08-24 ENCOUNTER — Other Ambulatory Visit: Payer: Self-pay

## 2013-08-24 ENCOUNTER — Other Ambulatory Visit: Payer: Self-pay | Admitting: Physician Assistant

## 2013-08-24 DIAGNOSIS — Z1231 Encounter for screening mammogram for malignant neoplasm of breast: Secondary | ICD-10-CM

## 2013-08-24 DIAGNOSIS — R922 Inconclusive mammogram: Secondary | ICD-10-CM

## 2013-08-24 DIAGNOSIS — Z0000 Encounter for general adult medical examination without abnormal findings: Secondary | ICD-10-CM

## 2013-09-25 ENCOUNTER — Ambulatory Visit
Admission: RE | Admit: 2013-09-25 | Discharge: 2013-09-25 | Disposition: A | Discharge status: Home / Self Care | Payer: Commercial Managed Care - HMO | Source: Ambulatory Visit

## 2013-09-25 ENCOUNTER — Other Ambulatory Visit: Payer: Commercial Managed Care - HMO

## 2013-09-25 DIAGNOSIS — Z1231 Encounter for screening mammogram for malignant neoplasm of breast: Secondary | ICD-10-CM

## 2013-10-02 ENCOUNTER — Ambulatory Visit (INDEPENDENT_AMBULATORY_CARE_PROVIDER_SITE_OTHER): Payer: Commercial Managed Care - HMO | Admitting: Physician Assistant

## 2013-10-02 ENCOUNTER — Encounter: Payer: Self-pay | Admitting: Physician Assistant

## 2013-10-02 VITALS — BP 131/68 | HR 59 | Ht 65.0 in | Wt 139.0 lb

## 2013-10-02 DIAGNOSIS — Z131 Encounter for screening for diabetes mellitus: Secondary | ICD-10-CM

## 2013-10-02 DIAGNOSIS — Z0000 Encounter for general adult medical examination without abnormal findings: Secondary | ICD-10-CM

## 2013-10-02 DIAGNOSIS — Z23 Encounter for immunization: Secondary | ICD-10-CM

## 2013-10-02 DIAGNOSIS — Z13820 Encounter for screening for osteoporosis: Secondary | ICD-10-CM

## 2013-10-02 DIAGNOSIS — F17200 Nicotine dependence, unspecified, uncomplicated: Secondary | ICD-10-CM

## 2013-10-02 DIAGNOSIS — E785 Hyperlipidemia, unspecified: Secondary | ICD-10-CM

## 2013-10-02 LAB — LIPID PANEL
CHOL/HDL RATIO: 2.3 ratio
Cholesterol: 157 mg/dL (ref 0–200)
HDL: 69 mg/dL (ref >39)
LDL CALC: 77 mg/dL (ref 0–99)
Triglycerides: 54 mg/dL (ref <150)
VLDL: 11 mg/dL (ref 0–40)

## 2013-10-02 LAB — COMPLETE METABOLIC PANEL WITH GFR
ALBUMIN: 4.4 g/dL (ref 3.5–5.2)
ALT: 17 U/L (ref 0–35)
AST: 21 U/L (ref 0–37)
Alkaline Phosphatase: 39 U/L (ref 39–117)
BUN: 15 mg/dL (ref 6–23)
CALCIUM: 9.8 mg/dL (ref 8.4–10.5)
CHLORIDE: 99 meq/L (ref 96–112)
CO2: 33 mEq/L — ABNORMAL HIGH (ref 19–32)
Creat: 0.8 mg/dL (ref 0.50–1.10)
GFR, Est African American: 89 mL/min
GFR, Est Non African American: 78 mL/min
Glucose, Bld: 89 mg/dL (ref 70–99)
POTASSIUM: 3.8 meq/L (ref 3.5–5.3)
SODIUM: 136 meq/L (ref 135–145)
Total Bilirubin: 0.5 mg/dL (ref 0.2–1.2)
Total Protein: 6.4 g/dL (ref 6.0–8.3)

## 2013-10-02 MED ORDER — LISINOPRIL-HYDROCHLOROTHIAZIDE 20-12.5 MG PO TABS: ORAL_TABLET | ORAL | Status: AC

## 2013-10-02 NOTE — Progress Notes (Signed)
Subjective:    Shelley Moore is a 63 y.o. female who presents for Medicare Initial preventive examination.  Preventive Screening-Counseling & Management  Tobacco History  Smoking status  . Current Every Day Smoker -- 1.00 packs/day  . Types: Cigarettes  Smokeless tobacco  . Not on file      Current Problems (verified) Patient Active Problem List   Diagnosis Date Noted  . Major depressive disorder 05/26/2013  . HTN (hypertension), benign 03/09/2013  . Systolic murmur 03/17/2012  . Influenza 03/17/2012  . Worsening headaches 03/17/2012  . Depression 07/27/2011  . Hyperlipidemia LDL goal < 100 07/27/2011  . Seasonal allergies 07/27/2011    Medications Prior to Visit Current Outpatient Prescriptions on File Prior to Visit  Medication Sig Dispense Refill  . cetirizine (ZYRTEC) 10 MG tablet Take 10 mg by mouth daily.      Marland Kitchen escitalopram (LEXAPRO) 20 MG tablet Take 20 mg by mouth daily.      Marland Kitchen lisinopril-hydrochlorothiazide (PRINZIDE,ZESTORETIC) 20-12.5 MG per tablet Take 2 tabs daily.  180 tablet  1  . pravastatin (PRAVACHOL) 40 MG tablet Take 1 tablet (40 mg total) by mouth daily.  90 tablet  1   No current facility-administered medications on file prior to visit.    Current Medications (verified) Current Outpatient Prescriptions  Medication Sig Dispense Refill  . cetirizine (ZYRTEC) 10 MG tablet Take 10 mg by mouth daily.      Marland Kitchen escitalopram (LEXAPRO) 20 MG tablet Take 20 mg by mouth daily.      Marland Kitchen lisinopril-hydrochlorothiazide (PRINZIDE,ZESTORETIC) 20-12.5 MG per tablet Take 2 tabs daily.  180 tablet  1  . pravastatin (PRAVACHOL) 40 MG tablet Take 1 tablet (40 mg total) by mouth daily.  90 tablet  1   No current facility-administered medications for this visit.     Allergies (verified) Review of patient's allergies indicates no known allergies.   PAST HISTORY  Family History Family History  Problem Relation Age of Onset  . Stroke Mother     Social  History History  Substance Use Topics  . Smoking status: Current Every Day Smoker -- 1.00 packs/day    Types: Cigarettes  . Smokeless tobacco: Not on file  . Alcohol Use: No     Are there smokers in your home (other than you)? Yes  Risk Factors Current exercise habits: The patient does not participate in regular exercise at present.  Dietary issues discussed: none   Cardiac risk factors: advanced age (older than 48 for men, 58 for women), dyslipidemia, family history of premature cardiovascular disease, hypertension, sedentary lifestyle and smoking/ tobacco exposure.  Depression Screen (Note: if answer to either of the following is "Yes", a more complete depression screening is indicated)   Over the past 2 weeks, have you felt down, depressed or hopeless? No  Over the past 2 weeks, have you felt little interest or pleasure in doing things? No  Have you lost interest or pleasure in daily life? No  Do you often feel hopeless? No  Do you cry easily over simple problems? No  Activities of Daily Living In your present state of health, do you have any difficulty performing the following activities?:  Driving? No Managing money?  No Feeding yourself? No Getting from bed to chair? No Climbing a flight of stairs? No Preparing food and eating?: No Bathing or showering? No Getting dressed: No Getting to the toilet? No Using the toilet:No Moving around from place to place: No In the past year have you fallen  or had a near fall?:No   Are you sexually active?  Yes  Do you have more than one partner?  No  Hearing Difficulties: No Do you often ask people to speak up or repeat themselves? No Do you experience ringing or noises in your ears? No Do you have difficulty understanding soft or whispered voices? No   Do you feel that you have a problem with memory? No  Do you often misplace items? No  Do you feel safe at home?  Yes  Cognitive Testing  Alert? Yes  Normal  Appearance?Yes  Oriented to person? Yes  Place? Yes   Time? Yes  Recall of three objects?  Yes  Can perform simple calculations? Yes  Displays appropriate judgment?Yes  Can read the correct time from a watch face?Yes   Advanced Directives have been discussed with the patient? Yes  List the Names of Other Physician/Practitioners you currently use: 1.    Indicate any recent Medical Services you may have received from other than [facility] providers in the past year (date may be approximate).  Immunization History  Administered Date(s) Administered  . Tdap 03/19/2009  . Zoster 02/28/2011    Screening Tests Health Maintenance  Topic Date Due  . Pneumococcal Polysaccharide Vaccine Age 65 And Over  06/04/2013  . Influenza Vaccine  10/17/2013  . Mammogram  09/26/2015  . Colonoscopy  07/26/2016  . Tetanus/tdap  07/27/2019  . Zostavax  Addressed    All answers were reviewed with the patient and necessary referrals were made:  [facility] Delainee Tramel, PA-C   10/02/2013   History reviewed: allergies, current medications, past family history, past medical history, past social history, past surgical history and problem list  Review of Systems A comprehensive review of systems was negative.    Objective:     Vision by Snellen chart: right eye:20/25, left eye:20/20  Body mass index is 23.13 kg/(m^2). BP 131/68  Pulse 59  Ht 5\' 5"  (1.651 m)  Wt 139 lb (63.05 kg)  BMI 23.13 kg/m2  BP 131/68  Pulse 59  Ht 5\' 5"  (1.651 m)  Wt 139 lb (63.05 kg)  BMI 23.13 kg/m2  General Appearance:    Alert, cooperative, no distress, appears stated age  Head:    Normocephalic, without obvious abnormality, atraumatic  Eyes:    PERRL, conjunctiva/corneas clear, EOM's intact, fundi    benign, both eyes  Ears:    Normal TM's and external ear canals, both ears  Nose:   Nares normal, septum midline, mucosa normal, no drainage    or sinus tenderness  Throat:   Lips, mucosa, and tongue normal; teeth and gums  normal  Neck:   Supple, symmetrical, trachea midline, no adenopathy;    thyroid:  no enlargement/tenderness/nodules; no carotid   bruit or JVD  Back:     Symmetric, no curvature, ROM normal, no CVA tenderness  Lungs:     Clear to auscultation bilaterally, respirations unlabored  Chest Wall:    No tenderness or deformity   Heart:    Regular rate and rhythm, S1 and S2 normal, no murmur, rub   or gallop  Breast Exam:    No tenderness, masses, or nipple abnormality  Abdomen:     Soft, non-tender, bowel sounds active all four quadrants,    no masses, no organomegaly        Extremities:   Extremities normal, atraumatic, no cyanosis or edema  Pulses:   2+ and symmetric all extremities  Skin:   Skin color, texture,  turgor normal, no rashes or lesions  Lymph nodes:   Cervical, supraclavicular, and axillary nodes normal  Neurologic:   CNII-XII intact, normal strength, sensation and reflexes    throughout       Assessment:     Normal woman exam      Plan:     During the course of the visit the patient was educated and counseled about appropriate screening and preventive services including:    Pneumococcal vaccine   Screening mammography  Bone densitometry screening  Diabetes screening  Smoking cessation counseling  prevnar given today.  Mammogram done and normal and bone density ordered.   Fasting labs ordered.   Hyperlipidemia- refilled pravachol.   HTN- refilled lisinopril/HCTZ.    Patient Instructions (the written plan) was given to the patient.  Medicare Attestation I have personally reviewed: The patient's medical and social history Their use of alcohol, tobacco or illicit drugs Their current medications and supplements The patient's functional ability including ADLs,fall risks, home safety risks, cognitive, and hearing and visual impairment Diet and physical activities Evidence for depression or mood disorders  The patient's weight, height, BMI, and visual  acuity have been recorded in the chart.  I have made referrals, counseling, and provided education to the patient based on review of the above and I have provided the patient with a written personalized care plan for preventive services.     Tandy Gaw, PA-C   10/02/2013

## 2013-10-02 NOTE — Patient Instructions (Signed)

## 2013-10-05 ENCOUNTER — Other Ambulatory Visit: Payer: Self-pay | Admitting: *Deleted

## 2013-10-05 MED ORDER — PRAVASTATIN SODIUM 40 MG PO TABS: 40.0000 mg | ORAL_TABLET | Freq: Every day | ORAL | Status: AC

## 2013-10-23 ENCOUNTER — Encounter: Payer: Self-pay | Admitting: Physician Assistant

## 2013-10-23 ENCOUNTER — Ambulatory Visit (INDEPENDENT_AMBULATORY_CARE_PROVIDER_SITE_OTHER): Payer: Commercial Managed Care - HMO | Admitting: Physician Assistant

## 2013-10-23 VITALS — BP 108/61 | HR 62 | Temp 98.2°F | Ht 65.0 in | Wt 140.0 lb

## 2013-10-23 DIAGNOSIS — J069 Acute upper respiratory infection, unspecified: Secondary | ICD-10-CM

## 2013-10-23 MED ORDER — AMOXICILLIN 500 MG PO CAPS: 500.0000 mg | ORAL_CAPSULE | Freq: Two times a day (BID) | ORAL | Status: DC

## 2013-10-23 NOTE — Progress Notes (Signed)
   Subjective:    Patient ID: Shelley Moore, female    DOB: 08-04-1950, 63 y.o.   MRN: [id number]  HPI Pt presents to the clinic with 1 day of URI symptoms of congestion and cough. Used nettie pot. No fever, chills, ST, ear pain, wheezing or SOB. Not tried anything OTC. No one else sick. Continues to smoke. Cough is minimally productive. Wanted to come in before the weekend.    Review of Systems  All other systems reviewed and are negative.      Objective:   Physical Exam  Constitutional: She is oriented to person, place, and time. She appears well-developed and well-nourished.  HENT:  Head: Normocephalic and atraumatic.  Right Ear: External ear normal.  Left Ear: External ear normal.  Nose: Nose normal.  Mouth/Throat: Oropharynx is clear and moist.  Eyes: Conjunctivae are normal. Right eye exhibits no discharge. Left eye exhibits no discharge.  Neck: Normal range of motion. Neck supple.  Cardiovascular: Normal rate, regular rhythm and normal heart sounds.   Pulmonary/Chest: Effort normal and breath sounds normal.  Coarse breath sounds.   Lymphadenopathy:    She has no cervical adenopathy.  Neurological: She is alert and oriented to person, place, and time.  Skin: Skin is dry.  Psychiatric: She has a normal mood and affect. Her behavior is normal.          Assessment & Plan:  URI- discussed with pt likely viral. Suggested mucinex D twice daily, nasocort, deslym for cough. HO given. If not improving or if worsening can take amoxil for 10 days given in form of script today. Discussed to only use if not improving. Smoking cessation given. Bronchitis could certainly occur if mucus continues to sit in lung. Hopefully mucinex will do the trick.

## 2013-10-23 NOTE — Patient Instructions (Addendum)
nasocort  mucinex D twice a day.  Cough- delsym  If not improving by Tuesday.   Upper Respiratory Infection, Adult An upper respiratory infection (URI) is also known as the common cold. It is often caused by a type of germ (virus). Colds are easily spread (contagious). You can pass it to others by kissing, coughing, sneezing, or drinking out of the same glass. Usually, you get better in 1 or 2 weeks.  HOME CARE   Only take medicine as told by your doctor.  Use a warm mist humidifier or breathe in steam from a hot shower.  Drink enough water and fluids to keep your pee (urine) clear or pale yellow.  Get plenty of rest.  Return to work when your temperature is back to normal or as told by your doctor. You may use a face mask and wash your hands to stop your cold from spreading. GET HELP RIGHT AWAY IF:   After the first few days, you feel you are getting worse.  You have questions about your medicine.  You have chills, shortness of breath, or brown or red spit (mucus).  You have yellow or brown snot (nasal discharge) or pain in the face, especially when you bend forward.  You have a fever, puffy (swollen) neck, pain when you swallow, or white spots in the back of your throat.  You have a bad headache, ear pain, sinus pain, or chest pain.  You have a high-pitched whistling sound when you breathe in and out (wheezing).  You have a lasting cough or cough up blood.  You have sore muscles or a stiff neck. MAKE SURE YOU:   Understand these instructions.  Will watch your condition.  Will get help right away if you are not doing well or get worse. Document Released: 08/22/2007 Document Revised: 05/28/2011 Document Reviewed: 06/10/2013 [facility] Patient Information 2015 [place], [place]. This information is not intended to replace advice given to you by your health care provider. Make sure you discuss any questions you have with your health care provider.

## 2013-12-02 ENCOUNTER — Other Ambulatory Visit: Payer: Self-pay | Admitting: Physician Assistant

## 2014-01-01 ENCOUNTER — Telehealth: Payer: Self-pay

## 2014-01-01 DIAGNOSIS — M25552 Pain in left hip: Secondary | ICD-10-CM

## 2014-01-01 NOTE — Telephone Encounter (Signed)
Shelley Moore has had left hip pain for years. She has had the hip pain daily for the last 3-4 weeks and taking ibuprofen daily. She would like a referral to Sports Medicine for a hip injection.

## 2014-01-12 ENCOUNTER — Ambulatory Visit (INDEPENDENT_AMBULATORY_CARE_PROVIDER_SITE_OTHER): Payer: Commercial Managed Care - HMO | Admitting: Sports Medicine

## 2014-01-12 ENCOUNTER — Encounter: Payer: Self-pay | Admitting: Sports Medicine

## 2014-01-12 VITALS — BP 133/72 | HR 62 | Wt 139.0 lb

## 2014-01-12 DIAGNOSIS — M7062 Trochanteric bursitis, left hip: Secondary | ICD-10-CM

## 2014-01-12 MED ORDER — MELOXICAM 15 MG PO TABS: ORAL_TABLET | ORAL | Status: DC

## 2014-01-12 NOTE — Progress Notes (Signed)
   Subjective:    I'm seeing this patient as a consultation for:  Tandy GawJade Breeback, PA-C  CC: left hip pain  HPI: This is a very pleasant 63 year old female, for the past several years she has had on and off pain that she localizes in the lateral aspect of her left hip, and flares occur it is painful to lie on the affected side. She has had steroid injections, these have resulted in temporary relief of her pain but it always comes back. It is moderate, persistent without radiation.  Past medical history, Surgical history, Family history not pertinant except as noted below, Social history, Allergies, and medications have been entered into the medical record, reviewed, and no changes needed.   Review of Systems: No headache, visual changes, nausea, vomiting, diarrhea, constipation, dizziness, abdominal pain, skin rash, fevers, chills, night sweats, weight loss, swollen lymph nodes, body aches, joint swelling, muscle aches, chest pain, shortness of breath, mood changes, visual or auditory hallucinations.   Objective:   General: Well Developed, well nourished, and in no acute distress.  Neuro/Psych: Alert and oriented x3, extra-ocular muscles intact, able to move all 4 extremities, sensation grossly intact. Skin: Warm and dry, no rashes noted.  Respiratory: Not using accessory muscles, speaking in full sentences, trachea midline.  Cardiovascular: Pulses palpable, no extremity edema. Abdomen: Does not appear distended. Left Hip: ROM IR: 60 Deg, ER: 60 Deg, Flexion: 120 Deg, Extension: 100 Deg, Abduction: 45 Deg, Adduction: 45 Deg Strength IR: 5/5, ER: 5/5, Flexion: 5/5, Extension: 5/5, Abduction: 5/5, Adduction: 5/5 Pelvic alignment unremarkable to inspection and palpation. Standing hip rotation and gait without trendelenburg / unsteadiness. Greater trochanter with significant tenderness to palpation. No tenderness over piriformis. No SI joint tenderness and normal minimal SI movement. Hip  abductor's are significantly weak on the left side compared to the right.  Impression and Recommendations:   This case required medical decision making of moderate complexity.

## 2014-01-12 NOTE — Assessment & Plan Note (Addendum)
With exquisitely weak hip abductor's. Formal physical therapy, Mobic. Return in one month. We will correct her biomechanical deficits before considering any interventional treatment.

## 2014-01-13 ENCOUNTER — Emergency Department
Admission: EM | Admit: 2014-01-13 | Discharge: 2014-01-13 | Disposition: A | Discharge status: Home / Self Care | Payer: Commercial Managed Care - HMO | Source: Home / Self Care | Attending: Family Medicine | Admitting: Family Medicine

## 2014-01-13 ENCOUNTER — Telehealth: Payer: Self-pay | Admitting: *Deleted

## 2014-01-13 ENCOUNTER — Encounter: Payer: Self-pay | Admitting: Emergency Medicine

## 2014-01-13 DIAGNOSIS — L270 Generalized skin eruption due to drugs and medicaments taken internally: Secondary | ICD-10-CM

## 2014-01-13 MED ORDER — PREDNISONE 5 MG PO KIT: PACK | ORAL | Status: AC

## 2014-01-13 MED ORDER — DICLOFENAC SODIUM 75 MG PO TBEC: 75.0000 mg | DELAYED_RELEASE_TABLET | Freq: Two times a day (BID) | ORAL | Status: AC

## 2014-01-13 NOTE — Telephone Encounter (Signed)
Switching to voltaren.

## 2014-01-13 NOTE — Telephone Encounter (Signed)
Shelley Moore called that she was having an allergic reaction to mobic that she took this morning. She said that she has hives and rash all over and redness that is increasing and worsening. Denies SOB but her eyes are swollen. [facility]. Corliss SkainsJamie Ashtian Villacis, RMA

## 2014-01-13 NOTE — Discharge Instructions (Signed)
Thank you for coming in today. Stop meloxicam. Take Benadryl daily for 12 days as directed. Take 50 mg of Benadryl tonight when you get home. Use over-the-counter Gold Bond Itch as needed for itching Go to the emergency room if you get worse. Call or go to the emergency room if you get worse, have trouble breathing, have chest pains, or palpitations.    Drug Allergy Allergic reactions to medicines are common. Some allergic reactions are mild. A delayed type of drug allergy that occurs 1 week or more after exposure to a medicine or vaccine is called serum sickness. A life-threatening, sudden (acute) allergic reaction that involves the whole body is called anaphylaxis. CAUSES  "True" drug allergies occur when there is an allergic reaction to a medicine. This is caused by overactivity of the immune system. First, the body becomes sensitized. The immune system is triggered by your first exposure to the medicine. Following this first exposure, future exposure to the same medicine may be life-threatening. Almost any medicine can cause an allergic reaction. Common ones are:  Penicillin.  Sulfonamides (sulfa drugs).  Local anesthetics.  X-ray dyes that contain iodine. SYMPTOMS  Common symptoms of a minor allergic reaction are:  Swelling around the mouth.  An itchy red rash or hives.  Vomiting or diarrhea. Anaphylaxis can cause swelling of the mouth and throat. This makes it difficult to breathe and swallow. Severe reactions can be fatal within seconds, even after exposure to only a trace amount of the drug that causes the reaction. HOME CARE INSTRUCTIONS   If you are unsure of what caused your reaction, keep a diary of foods and medicines used. Include the symptoms that followed. Avoid anything that causes reactions.  You may want to follow up with an allergy specialist after the reaction has cleared in order to be tested to confirm the allergy. It is important to confirm that your  reaction is an allergy, not just a side effect to the medicine. If you have a true allergy to a medicine, this may prevent that medicine and related medicines from being given to you when you are very ill.  If you have hives or a rash:  Take medicines as directed by your caregiver.  You may use an over-the-counter antihistamine (diphenhydramine) as needed.  Apply cold compresses to the skin or take baths in cool water. Avoid hot baths or showers.  If you are severely allergic:  Continuous observation after a severe reaction may be needed. Hospitalization is often required.  Wear a medical alert bracelet or necklace stating your allergy.  You and your family must learn how to use an anaphylaxis kit or give an epinephrine injection to temporarily treat an emergency allergic reaction. If you have had a severe reaction, always carry your epinephrine injection or anaphylaxis kit with you. This can be lifesaving if you have a severe reaction.  Do not drive or perform tasks after treatment until the medicines used to treat your reaction have worn off, or until your caregiver says it is okay. SEEK MEDICAL CARE IF:   You think you had an allergic reaction. Symptoms usually start within 30 minutes after exposure.  Symptoms are getting worse rather than better.  You develop new symptoms.  The symptoms that brought you to your caregiver return. SEEK IMMEDIATE MEDICAL CARE IF:   You have swelling of the mouth, difficulty breathing, or wheezing.  You have a tight feeling in your chest or throat.  You develop hives, swelling, or itching all  over your body.  You develop severe vomiting or diarrhea.  You feel faint or pass out. This is an emergency. Use your epinephrine injection or anaphylaxis kit as you have been instructed. Call for emergency medical help. Even if you improve after the injection, you need to be examined at a hospital emergency department. MAKE SURE YOU:   Understand these  instructions.  Will watch your condition.  Will get help right away if you are not doing well or get worse. Document Released: 03/05/2005 Document Revised: 05/28/2011 Document Reviewed: 08/09/2010 [facility] Patient Information 2015 [place], [place]. This information is not intended to replace advice given to you by your health care provider. Make sure you discuss any questions you have with your health care provider.

## 2014-01-13 NOTE — ED Provider Notes (Addendum)
Shelley Moore is a 63 y.o. female who presents to Urgent Care today for rash. Patient has an itchy rash on her chest face and extremities starting yesterday 6 hours after she took her first dose of meloxicam. . She has taken ibuprofen in the past with no problems. She denies any tongue or lip swelling or trouble breathing or throat swelling. She feels well otherwise. She is quite itchy. She has not tried any medications yet for treatment.   Past Medical History  Diagnosis Date  . Allergy   . Depression   . Spontaneous pneumothorax     4 different times in the 90's.  Marland Kitchen Hypertension    History  Substance Use Topics  . Smoking status: Current Every Day Smoker -- 1.00 packs/day    Types: Cigarettes  . Smokeless tobacco: Not on file  . Alcohol Use: No   ROS as above Medications: No current facility-administered medications for this encounter.   Current Outpatient Prescriptions  Medication Sig Dispense Refill  . Calcium Carbonate-Vitamin D (CALCIUM + D PO) Take by mouth.      . cetirizine (ZYRTEC) 10 MG tablet Take 10 mg by mouth daily.      Marland Kitchen escitalopram (LEXAPRO) 20 MG tablet Take 20 mg by mouth daily.      Marland Kitchen lisinopril-hydrochlorothiazide (PRINZIDE,ZESTORETIC) 20-12.5 MG per tablet Take 2 tabs daily.  180 tablet  1  . pravastatin (PRAVACHOL) 40 MG tablet Take 1 tablet (40 mg total) by mouth daily.  90 tablet  2  . PredniSONE 5 MG KIT 12 day dosepack po  1 kit  0    Exam:  BP 102/63  Pulse 61  Temp(Src) 97.9 F (36.6 C) (Oral)  Resp 16  Ht 5' 5" (1.651 m)  Wt 140 lb (63.504 kg)  BMI 23.30 kg/m2  SpO2 100% Gen: Well NAD HEENT: EOMI,  MMM no mucocutaneous lesions or lip or tongue swelling. Lungs: Normal work of breathing. CTABL Heart: RRR no MRG Abd: NABS, Soft. Nondistended, Nontender Exts: Brisk capillary refill, warm and well perfused.  Skin: Fine erythematous macular rash across trunk and extremities. Nontender. Blanchable.  No results found for this or any  previous visit (from the past 24 hour(s)). No results found.  Assessment and Plan: 63 y.o. female with rash. Likely related to meloxicam. Discontinue meloxicam. Start prednisone Benadryl and Goldbond itch. Follow-up with PCP.  Discussed warning signs or symptoms. Please see discharge instructions. Patient expresses understanding.     Gregor Hams, MD 01/13/14 Rowe Corey, MD 01/13/14 [phone]

## 2014-01-13 NOTE — ED Notes (Addendum)
Pt c/o being red all over her body, under her eyes is swollen and itching x today, after being Meloxicam yesterday.

## 2014-01-14 NOTE — Telephone Encounter (Signed)
Shelley Moore thanked you for sending in different medication and she said that she will start it as soon as she is finished with the prednisone that she started last evening for her allergic reaction. Corliss SkainsJamie Bandon Sherwin, RMA

## 2014-01-19 ENCOUNTER — Telehealth: Payer: Self-pay

## 2014-01-19 NOTE — Telephone Encounter (Signed)
Patient called and left a message on nurse line asking for a return call.   Returned Call: Left message asking patient to call back.  

## 2014-01-19 NOTE — Telephone Encounter (Signed)
Recommend over-the-counter Benadryl. She connection take 2 tabs which is equivalent to 50 mg but will likely be sedating.

## 2014-01-19 NOTE — Telephone Encounter (Signed)
Shelley Moore was seen in the urgent care last week for an allergic reaction to meloxicam. The swelling and rash has resolved. She still has the itching and a slight headache. She would like something for the itching. Please advise.

## 2014-01-21 NOTE — Telephone Encounter (Signed)
Left detailed message.   

## 2014-01-22 ENCOUNTER — Ambulatory Visit: Payer: Commercial Managed Care - HMO | Admitting: Physical Therapy

## 2014-02-09 ENCOUNTER — Ambulatory Visit: Payer: Commercial Managed Care - HMO | Admitting: Sports Medicine

## 2014-02-09 DIAGNOSIS — Z0289 Encounter for other administrative examinations: Secondary | ICD-10-CM

## 2014-09-10 ENCOUNTER — Other Ambulatory Visit: Payer: Self-pay | Admitting: Family Medicine

## 2014-09-10 DIAGNOSIS — Z1231 Encounter for screening mammogram for malignant neoplasm of breast: Secondary | ICD-10-CM

## 2014-10-07 ENCOUNTER — Ambulatory Visit (INDEPENDENT_AMBULATORY_CARE_PROVIDER_SITE_OTHER): Payer: Commercial Managed Care - HMO

## 2014-10-07 DIAGNOSIS — Z1231 Encounter for screening mammogram for malignant neoplasm of breast: Secondary | ICD-10-CM | POA: Diagnosis not present

## 2015-12-06 ENCOUNTER — Other Ambulatory Visit: Payer: Self-pay | Admitting: Family Medicine

## 2015-12-06 ENCOUNTER — Ambulatory Visit: Payer: Commercial Managed Care - HMO

## 2015-12-06 DIAGNOSIS — Z1231 Encounter for screening mammogram for malignant neoplasm of breast: Secondary | ICD-10-CM

## 2015-12-13 ENCOUNTER — Ambulatory Visit (INDEPENDENT_AMBULATORY_CARE_PROVIDER_SITE_OTHER): Payer: Commercial Managed Care - HMO

## 2015-12-13 DIAGNOSIS — Z1231 Encounter for screening mammogram for malignant neoplasm of breast: Secondary | ICD-10-CM | POA: Diagnosis not present

## 2016-11-09 ENCOUNTER — Other Ambulatory Visit: Payer: Self-pay | Admitting: Family Medicine

## 2016-11-09 DIAGNOSIS — Z1231 Encounter for screening mammogram for malignant neoplasm of breast: Secondary | ICD-10-CM

## 2016-12-14 ENCOUNTER — Ambulatory Visit (INDEPENDENT_AMBULATORY_CARE_PROVIDER_SITE_OTHER): Payer: Medicare HMO

## 2016-12-14 DIAGNOSIS — Z1231 Encounter for screening mammogram for malignant neoplasm of breast: Secondary | ICD-10-CM | POA: Diagnosis not present

## 2017-11-13 ENCOUNTER — Other Ambulatory Visit: Payer: Self-pay | Admitting: Family Medicine

## 2017-11-13 DIAGNOSIS — Z1231 Encounter for screening mammogram for malignant neoplasm of breast: Secondary | ICD-10-CM

## 2017-11-13 DIAGNOSIS — Z780 Asymptomatic menopausal state: Secondary | ICD-10-CM

## 2017-12-18 ENCOUNTER — Ambulatory Visit (INDEPENDENT_AMBULATORY_CARE_PROVIDER_SITE_OTHER): Payer: Medicare HMO

## 2017-12-18 DIAGNOSIS — Z1231 Encounter for screening mammogram for malignant neoplasm of breast: Secondary | ICD-10-CM | POA: Diagnosis not present

## 2017-12-18 DIAGNOSIS — Z780 Asymptomatic menopausal state: Secondary | ICD-10-CM

## 2017-12-18 DIAGNOSIS — M8589 Other specified disorders of bone density and structure, multiple sites: Secondary | ICD-10-CM | POA: Diagnosis not present

## 2018-11-20 ENCOUNTER — Other Ambulatory Visit: Payer: Self-pay | Admitting: Family Medicine

## 2018-11-20 DIAGNOSIS — Z1231 Encounter for screening mammogram for malignant neoplasm of breast: Secondary | ICD-10-CM

## 2018-12-24 ENCOUNTER — Ambulatory Visit (INDEPENDENT_AMBULATORY_CARE_PROVIDER_SITE_OTHER): Payer: Medicare HMO

## 2018-12-24 ENCOUNTER — Other Ambulatory Visit: Payer: Self-pay

## 2018-12-24 DIAGNOSIS — Z1231 Encounter for screening mammogram for malignant neoplasm of breast: Secondary | ICD-10-CM

## 2019-05-15 ENCOUNTER — Ambulatory Visit: Payer: Medicare HMO | Attending: Internal Medicine

## 2019-05-15 DIAGNOSIS — Z23 Encounter for immunization: Secondary | ICD-10-CM | POA: Insufficient documentation

## 2019-05-15 NOTE — Progress Notes (Signed)
   Covid-19 Vaccination Clinic  Name:  Shelley Moore    MRN: [id number] DOB: 1950/04/30  05/15/2019  [name] was observed post Covid-19 immunization for 15 minutes without incidence. She was provided with Vaccine Information Sheet and instruction to access the V-Safe system.   [name] was instructed to call 911 with any severe reactions post vaccine: Marland Kitchen Difficulty breathing  . Swelling of your face and throat  . A fast heartbeat  . A bad rash all over your body  . Dizziness and weakness    Immunizations Administered    Name Date Dose VIS Date Route   Pfizer COVID-19 Vaccine 05/15/2019 12:18 PM 0.3 mL 02/27/2019 Intramuscular   Manufacturer: [employer], [employer]   Lot: [id number]   NDC: [address]-7

## 2019-06-09 ENCOUNTER — Ambulatory Visit: Payer: Medicare HMO | Attending: Internal Medicine

## 2019-06-09 DIAGNOSIS — Z23 Encounter for immunization: Secondary | ICD-10-CM

## 2019-06-09 NOTE — Progress Notes (Signed)
   [facility] Vaccination [facility]  Name:  Shelley Moore    MRN: [id number] DOB: [birth year]  06/09/2019  [name] was observed post Covid-19 immunization for 15 minutes without incident. She was provided with Vaccine Information Sheet and instruction to access the V-Safe system.   [name] was instructed to call 911 with any severe reactions post vaccine: Marland Kitchen Difficulty breathing  . Swelling of face and throat  . A fast heartbeat  . A bad rash all over body  . Dizziness and weakness   Immunizations Administered    Name Date Dose VIS Date Route   Pfizer COVID-19 Vaccine 06/09/2019 12:38 PM 0.3 mL 02/27/2019 Intramuscular   Manufacturer: [employer], [employer]   Lot: [id number]   NDC: [address]-6

## 2019-12-10 ENCOUNTER — Other Ambulatory Visit: Payer: Self-pay | Admitting: Family Medicine

## 2019-12-10 DIAGNOSIS — M85852 Other specified disorders of bone density and structure, left thigh: Secondary | ICD-10-CM

## 2019-12-10 DIAGNOSIS — Z780 Asymptomatic menopausal state: Secondary | ICD-10-CM

## 2019-12-10 DIAGNOSIS — Z1231 Encounter for screening mammogram for malignant neoplasm of breast: Secondary | ICD-10-CM

## 2019-12-30 ENCOUNTER — Other Ambulatory Visit: Payer: Self-pay

## 2019-12-30 ENCOUNTER — Ambulatory Visit (INDEPENDENT_AMBULATORY_CARE_PROVIDER_SITE_OTHER): Payer: Medicare HMO

## 2019-12-30 DIAGNOSIS — Z13820 Encounter for screening for osteoporosis: Secondary | ICD-10-CM

## 2019-12-30 DIAGNOSIS — Z1231 Encounter for screening mammogram for malignant neoplasm of breast: Secondary | ICD-10-CM | POA: Diagnosis not present

## 2019-12-30 DIAGNOSIS — M85852 Other specified disorders of bone density and structure, left thigh: Secondary | ICD-10-CM

## 2019-12-30 DIAGNOSIS — Z780 Asymptomatic menopausal state: Secondary | ICD-10-CM

## 2020-12-19 ENCOUNTER — Other Ambulatory Visit: Payer: Self-pay | Admitting: Family Medicine

## 2020-12-19 DIAGNOSIS — Z1231 Encounter for screening mammogram for malignant neoplasm of breast: Secondary | ICD-10-CM

## 2021-01-04 ENCOUNTER — Other Ambulatory Visit: Payer: Self-pay

## 2021-01-04 ENCOUNTER — Ambulatory Visit (INDEPENDENT_AMBULATORY_CARE_PROVIDER_SITE_OTHER): Payer: Medicare HMO

## 2021-01-04 DIAGNOSIS — Z1231 Encounter for screening mammogram for malignant neoplasm of breast: Secondary | ICD-10-CM

## 2023-05-17 IMAGING — MG MM DIGITAL SCREENING BILAT W/ TOMO AND CAD
8 series · 8 of 24 positions shown · non-contrast
Comparison: Previous exam(s).

CLINICAL DATA: Screening.

EXAM:
DIGITAL SCREENING BILATERAL MAMMOGRAM WITH TOMOSYNTHESIS AND CAD
TECHNIQUE: Bilateral screening digital craniocaudal and mediolateral oblique
mammograms were obtained. Bilateral screening digital breast
tomosynthesis was performed. The images were evaluated with
computer-aided detection.

[R CC synth-2D]
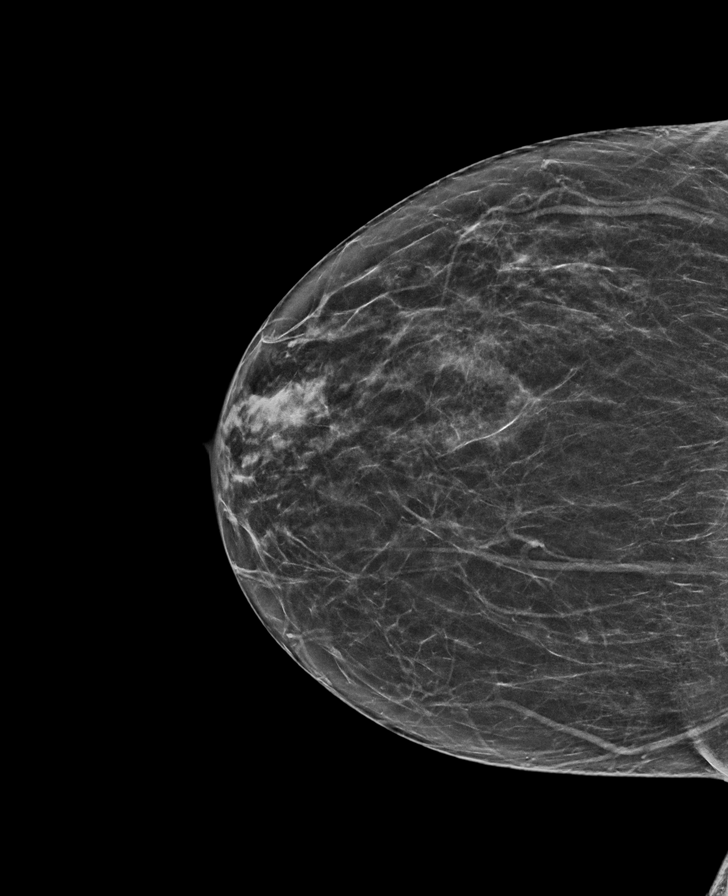

[L MLO synth-2D]
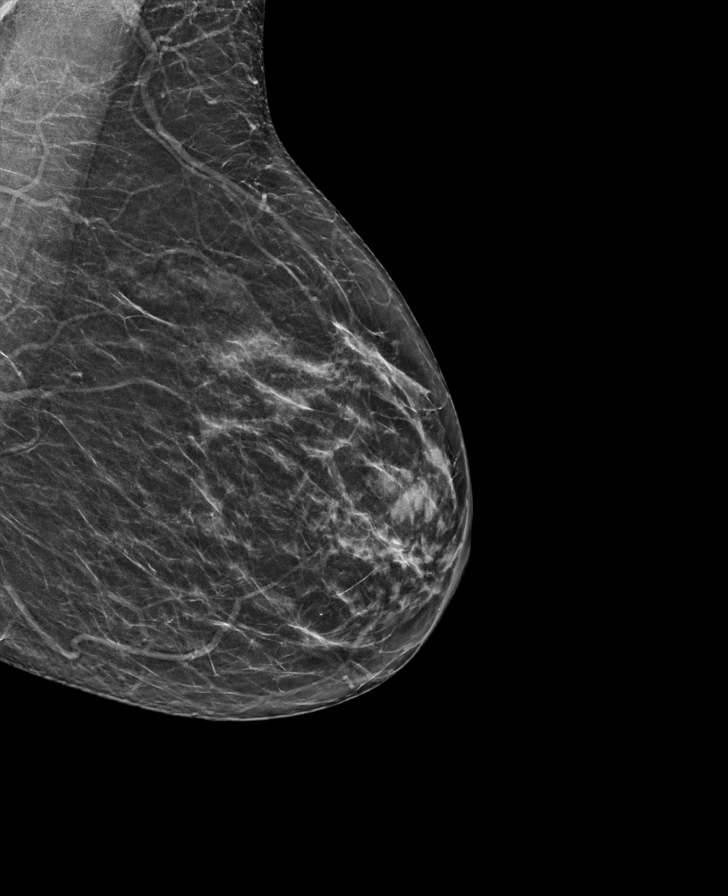

[R MLO synth-2D]
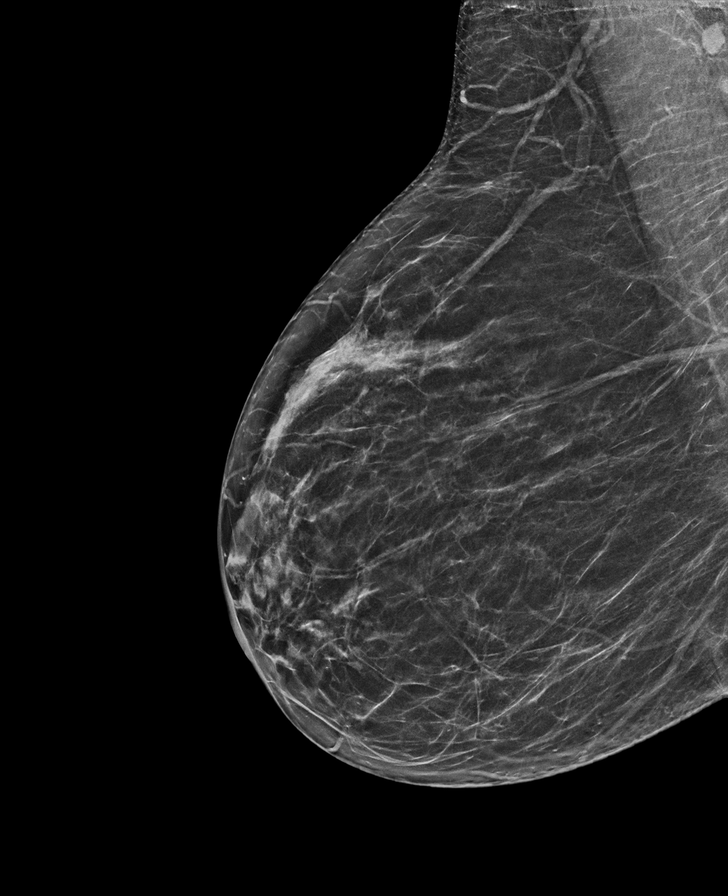

[L CC synth-2D]
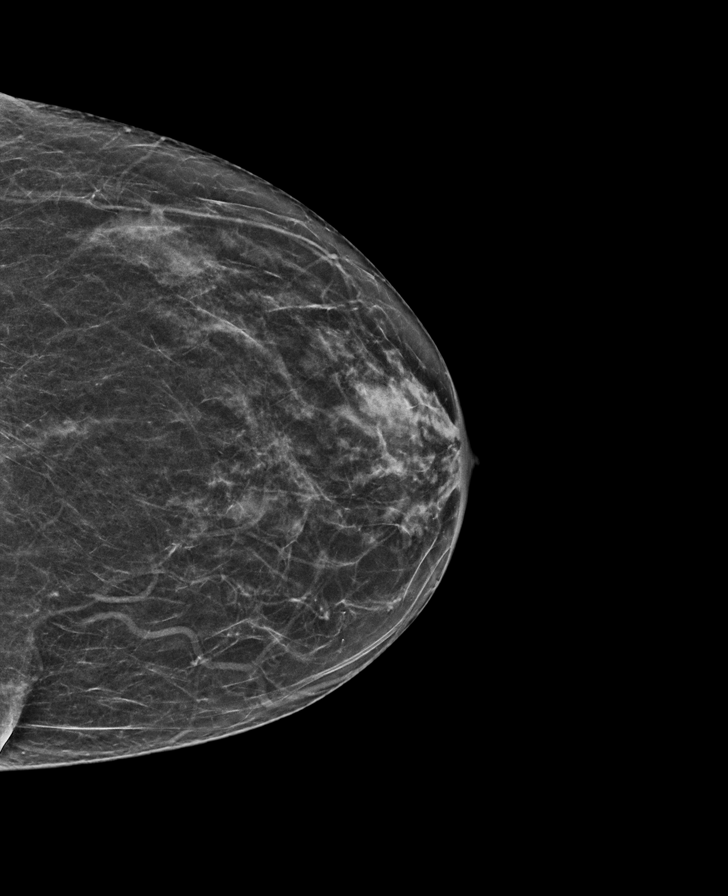

[L CC tomo · tomo slice 29/57.0]
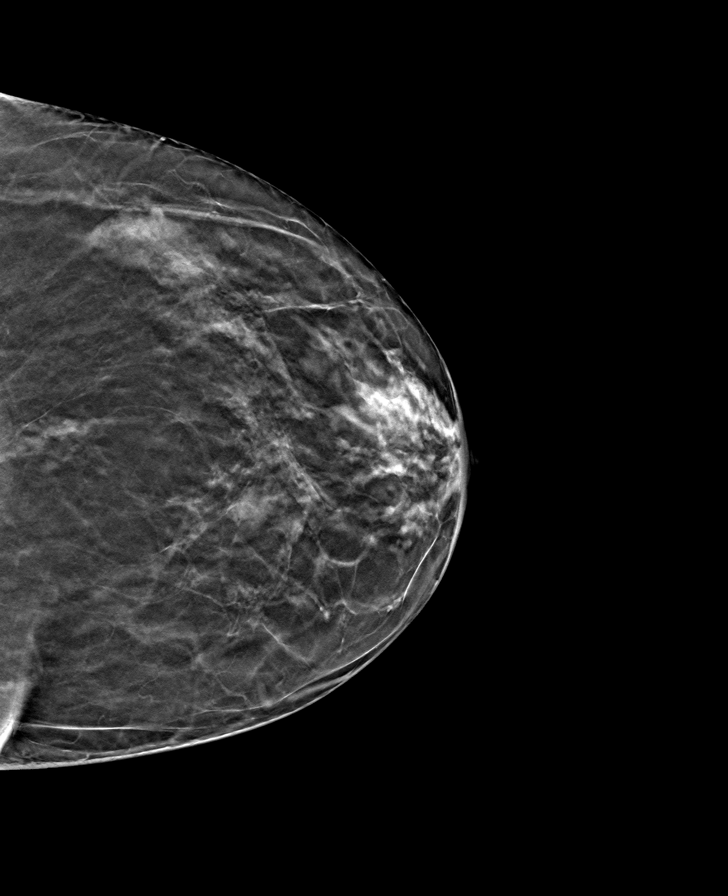

[R CC tomo · tomo slice 31/60.0]
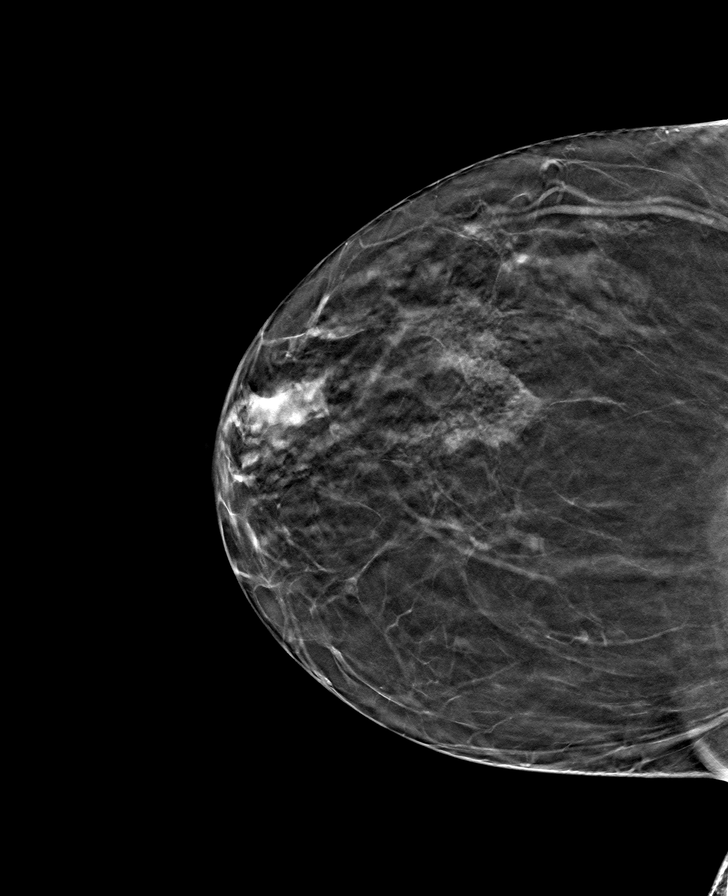

[L MLO tomo · tomo slice 31/60.0]
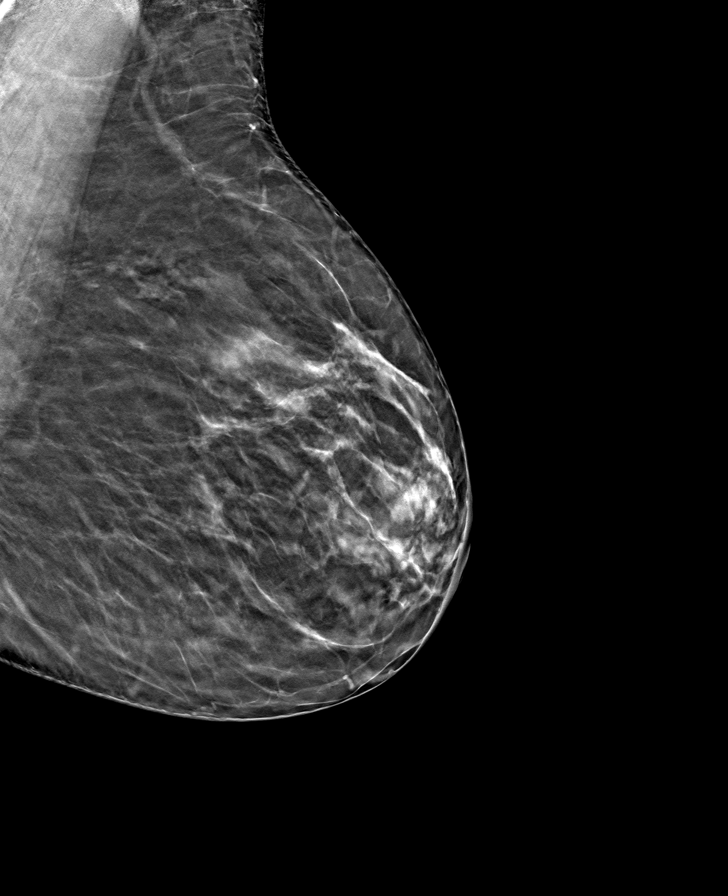

[R MLO tomo · tomo slice 32/63.0]
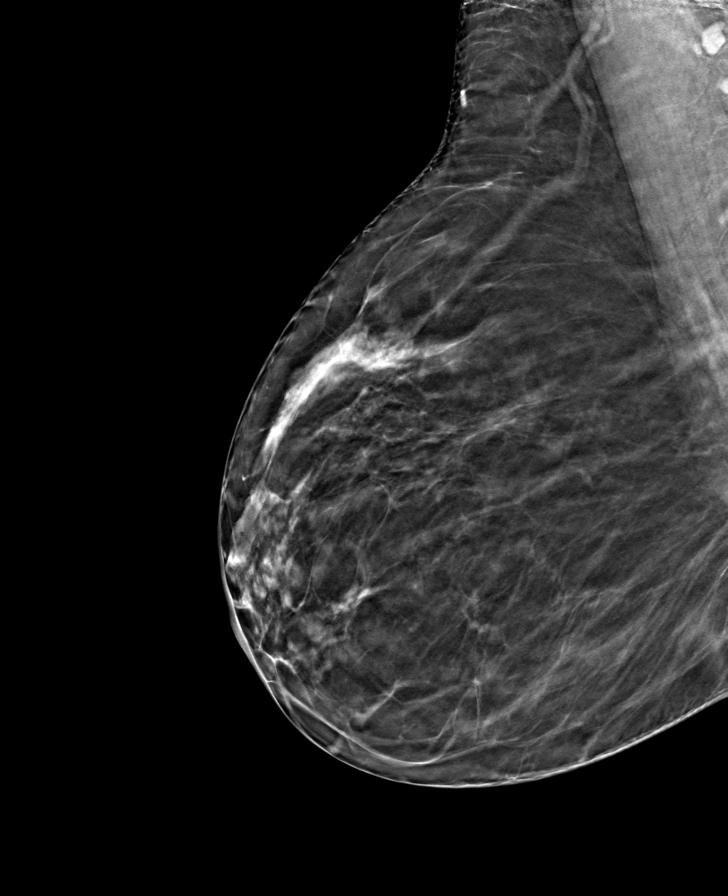

[8 of 24 positions shown; findings below may reference images not displayed]

ACR Breast Density Category b: There are scattered areas of
fibroglandular density.
FINDINGS: There are no findings suspicious for malignancy.
IMPRESSION: No mammographic evidence of malignancy. A result letter of this
screening mammogram will be mailed directly to the patient.

RECOMMENDATION:
Screening mammogram in one year. (Code:51-O-LD2)

BI-RADS CATEGORY  1: Negative.
